# Patient Record
Sex: Male | Born: 1965 | Race: White | Hispanic: No | Marital: Married | Smoking: Never smoker
Health system: Southern US, Community
[De-identification: ages and names within clinical notes are randomized; demographics above are authoritative.]

## PROBLEM LIST (undated history)

## (undated) DIAGNOSIS — R7301 Impaired fasting glucose: Secondary | ICD-10-CM

## (undated) DIAGNOSIS — J309 Allergic rhinitis, unspecified: Secondary | ICD-10-CM

## (undated) DIAGNOSIS — E785 Hyperlipidemia, unspecified: Secondary | ICD-10-CM

## (undated) DIAGNOSIS — I10 Essential (primary) hypertension: Secondary | ICD-10-CM

## (undated) DIAGNOSIS — E669 Obesity, unspecified: Secondary | ICD-10-CM

## (undated) DIAGNOSIS — G5600 Carpal tunnel syndrome, unspecified upper limb: Secondary | ICD-10-CM

## (undated) HISTORY — PX: VASECTOMY: SHX75

## (undated) HISTORY — DX: Impaired fasting glucose: R73.01

## (undated) HISTORY — DX: Allergic rhinitis, unspecified: J30.9

## (undated) HISTORY — DX: Hyperlipidemia, unspecified: E78.5

## (undated) HISTORY — DX: Essential (primary) hypertension: I10

## (undated) HISTORY — DX: Obesity, unspecified: E66.9

## (undated) HISTORY — PX: OTHER SURGICAL HISTORY: SHX169

## (undated) HISTORY — DX: Carpal tunnel syndrome, unspecified upper limb: G56.00

---

## 2002-12-11 ENCOUNTER — Other Ambulatory Visit: Admission: RE | Admit: 2002-12-11 | Discharge: 2002-12-11 | Payer: Self-pay | Admitting: Urology

## 2005-08-14 ENCOUNTER — Ambulatory Visit (HOSPITAL_COMMUNITY): Admission: RE | Admit: 2005-08-14 | Discharge: 2005-08-14 | Payer: Self-pay | Admitting: General Surgery

## 2012-08-09 ENCOUNTER — Encounter: Payer: Self-pay | Admitting: *Deleted

## 2012-08-12 ENCOUNTER — Encounter: Payer: Self-pay | Admitting: Nurse Practitioner

## 2012-08-12 ENCOUNTER — Ambulatory Visit (INDEPENDENT_AMBULATORY_CARE_PROVIDER_SITE_OTHER): Payer: 59 | Admitting: Nurse Practitioner

## 2012-08-12 ENCOUNTER — Encounter: Payer: Self-pay | Admitting: *Deleted

## 2012-08-12 VITALS — BP 146/104 | HR 70 | Wt 268.1 lb

## 2012-08-12 DIAGNOSIS — Z13220 Encounter for screening for lipoid disorders: Secondary | ICD-10-CM

## 2012-08-12 DIAGNOSIS — I10 Essential (primary) hypertension: Secondary | ICD-10-CM

## 2012-08-12 DIAGNOSIS — E785 Hyperlipidemia, unspecified: Secondary | ICD-10-CM

## 2012-08-12 DIAGNOSIS — Z139 Encounter for screening, unspecified: Secondary | ICD-10-CM

## 2012-08-12 DIAGNOSIS — R7301 Impaired fasting glucose: Secondary | ICD-10-CM

## 2012-08-12 MED ORDER — MONTELUKAST SODIUM 10 MG PO TABS: 10.0000 mg | ORAL_TABLET | Freq: Every day | ORAL | Status: DC

## 2012-08-12 MED ORDER — ENALAPRIL MALEATE 10 MG PO TABS: 10.0000 mg | ORAL_TABLET | Freq: Every day | ORAL | Status: DC

## 2012-08-12 NOTE — Assessment & Plan Note (Signed)
Restart enalapril, BP stable on medication. Lab work ordered.

## 2012-08-12 NOTE — Assessment & Plan Note (Signed)
Met 7 pending. 

## 2012-08-12 NOTE — Assessment & Plan Note (Signed)
Lipid profile pending. 

## 2012-08-12 NOTE — Progress Notes (Signed)
Subjective:  Presents for recheck of his hypertension. Ran out of his medication 3 days ago. Has brought his blood pressure log with him, his BP is running 109-127/70s. No chest pain/ischemic type pain or shortness of breath. Has lost some weight. Walking 3 days per week. Overall doing well with his diet, has cut out a lot of sugar. Also requesting a refill on his Singulair, uses this when necessary allergies which is working very well.  Objective:   BP 146/104  Pulse 70  Wt 268 lb 2 oz (121.621 kg) NAD. Alert, oriented. Lungs clear. Heart regular rate rhythm. No murmur or gallop noted. Carotids no bruits or thrills. Lower extremities no edema.   Assessment: Hypertension uncontrolled today due to noncompliance  Plan: #1 lab work ordered. Refill enalapril. Also refill Singulair per patient request. Encourage weight loss and regular activity. Recheck 6 months, call back sooner if any problems.

## 2012-08-20 LAB — LIPID PANEL
LDL Cholesterol: 108 mg/dL — ABNORMAL HIGH (ref 0–99)
Triglycerides: 178 mg/dL — ABNORMAL HIGH (ref <150)
VLDL: 36 mg/dL (ref 0–40)

## 2012-08-20 LAB — BASIC METABOLIC PANEL
Chloride: 104 mEq/L (ref 96–112)
Potassium: 4.3 mEq/L (ref 3.5–5.3)

## 2012-08-20 LAB — HEPATIC FUNCTION PANEL
ALT: 26 U/L (ref 0–53)
AST: 18 U/L (ref 0–37)
Alkaline Phosphatase: 39 U/L (ref 39–117)
Bilirubin, Direct: 0.2 mg/dL (ref 0.0–0.3)
Indirect Bilirubin: 1 mg/dL — ABNORMAL HIGH (ref 0.0–0.9)

## 2012-08-21 ENCOUNTER — Encounter: Payer: Self-pay | Admitting: Nurse Practitioner

## 2013-02-20 ENCOUNTER — Other Ambulatory Visit: Payer: Self-pay | Admitting: Nurse Practitioner

## 2013-02-20 ENCOUNTER — Telehealth: Payer: Self-pay | Admitting: Family Medicine

## 2013-02-20 MED ORDER — ENALAPRIL MALEATE 10 MG PO TABS: 10.0000 mg | ORAL_TABLET | Freq: Every day | ORAL | Status: DC

## 2013-02-20 NOTE — Telephone Encounter (Signed)
Patient states he needs a refill on his enalapril (VASOTEC) 10 MG tablet.  Has an appointment on next Tuesday for a Follow Up.  [facility].

## 2013-02-25 ENCOUNTER — Encounter: Payer: Self-pay | Admitting: Family Medicine

## 2013-02-25 ENCOUNTER — Ambulatory Visit (INDEPENDENT_AMBULATORY_CARE_PROVIDER_SITE_OTHER): Payer: 59 | Admitting: Family Medicine

## 2013-02-25 VITALS — BP 148/88 | Ht 70.0 in | Wt 273.2 lb

## 2013-02-25 DIAGNOSIS — J309 Allergic rhinitis, unspecified: Secondary | ICD-10-CM | POA: Insufficient documentation

## 2013-02-25 DIAGNOSIS — E785 Hyperlipidemia, unspecified: Secondary | ICD-10-CM

## 2013-02-25 DIAGNOSIS — I10 Essential (primary) hypertension: Secondary | ICD-10-CM

## 2013-02-25 MED ORDER — ENALAPRIL MALEATE 10 MG PO TABS: 10.0000 mg | ORAL_TABLET | Freq: Every day | ORAL | Status: DC

## 2013-02-25 MED ORDER — MONTELUKAST SODIUM 10 MG PO TABS: 10.0000 mg | ORAL_TABLET | Freq: Every day | ORAL | Status: DC

## 2013-02-25 NOTE — Progress Notes (Signed)
  Subjective:    Patient ID: Devon Mora, male    DOB: 29-Aug-1965, 47 y.o.   MRN: [id number]  HPI Pt is here today for a check up.  He needs a refill on his meds. Compliant with meds. Walking regularly. No s e s from med. Watching salt intake  No concerns.   Compliant with his allergy medicine. Overall control things well but did have some wheezing last week after exposure to dust.  Results for orders placed in visit on 08/12/12  BASIC METABOLIC PANEL      Result Value Range   Sodium 137  135 - 145 mEq/L   Potassium 4.3  3.5 - 5.3 mEq/L   Chloride 104  96 - 112 mEq/L   CO2 26  19 - 32 mEq/L   Glucose, Bld 88  70 - 99 mg/dL   BUN 16  6 - 23 mg/dL   Creat 4.09  8.11 - 9.14 mg/dL   Calcium 8.8  8.4 - 78.2 mg/dL  HEPATIC FUNCTION PANEL      Result Value Range   Total Bilirubin 1.2  0.3 - 1.2 mg/dL   Bilirubin, Direct 0.2  0.0 - 0.3 mg/dL   Indirect Bilirubin 1.0 (*) 0.0 - 0.9 mg/dL   Alkaline Phosphatase 39  39 - 117 U/L   AST 18  0 - 37 U/L   ALT 26  0 - 53 U/L   Total Protein 6.5  6.0 - 8.3 g/dL   Albumin 4.2  3.5 - 5.2 g/dL  LIPID PANEL      Result Value Range   Cholesterol 179  0 - 200 mg/dL   Triglycerides 956 (*) <150 mg/dL   HDL 35 (*) >21 mg/dL   Total CHOL/HDL Ratio 5.1     VLDL 36  0 - 40 mg/dL   LDL Cholesterol 308 (*) 0 - 99 mg/dL     Review of Systems No headache no chest pain no back pain no abdominal pain ROS otherwise negative    Objective:   Physical Exam  Alert HEENT normal. Lungs clear. Heart regular in rhythm. Blood pressure good on repeat.      Assessment & Plan:  Impression 1 hypertension good control. #2 allergic rhinitis stable. Plan maintain same meds. Diet exercise discussed. Check in 6 months. WSL

## 2013-08-06 ENCOUNTER — Other Ambulatory Visit: Payer: Self-pay | Admitting: Nurse Practitioner

## 2014-03-03 ENCOUNTER — Other Ambulatory Visit: Payer: Self-pay | Admitting: Family Medicine

## 2014-03-31 ENCOUNTER — Other Ambulatory Visit: Payer: Self-pay | Admitting: Family Medicine

## 2014-05-08 ENCOUNTER — Encounter: Payer: Self-pay | Admitting: Family Medicine

## 2014-05-08 ENCOUNTER — Ambulatory Visit (INDEPENDENT_AMBULATORY_CARE_PROVIDER_SITE_OTHER): Payer: 59 | Admitting: Family Medicine

## 2014-05-08 VITALS — BP 148/94 | Ht 70.0 in | Wt 268.0 lb

## 2014-05-08 DIAGNOSIS — J4520 Mild intermittent asthma, uncomplicated: Secondary | ICD-10-CM

## 2014-05-08 DIAGNOSIS — E785 Hyperlipidemia, unspecified: Secondary | ICD-10-CM

## 2014-05-08 DIAGNOSIS — I10 Essential (primary) hypertension: Secondary | ICD-10-CM

## 2014-05-08 DIAGNOSIS — J301 Allergic rhinitis due to pollen: Secondary | ICD-10-CM

## 2014-05-08 DIAGNOSIS — Z79899 Other long term (current) drug therapy: Secondary | ICD-10-CM

## 2014-05-08 DIAGNOSIS — Z125 Encounter for screening for malignant neoplasm of prostate: Secondary | ICD-10-CM

## 2014-05-08 MED ORDER — ENALAPRIL MALEATE 10 MG PO TABS: ORAL_TABLET | ORAL | Status: DC

## 2014-05-08 MED ORDER — ALBUTEROL SULFATE HFA 108 (90 BASE) MCG/ACT IN AERS: INHALATION_SPRAY | RESPIRATORY_TRACT | Status: DC

## 2014-05-08 MED ORDER — MONTELUKAST SODIUM 10 MG PO TABS: 10.0000 mg | ORAL_TABLET | Freq: Every day | ORAL | Status: DC

## 2014-05-08 NOTE — Progress Notes (Signed)
   Subjective:    Patient ID: Devon Mora, male    DOB: 02-01-1966, 49 y.o.   MRN: [id number]  HPI Patient is here today for a check up.  He needs a refill on his meds. Exercising regularly, now t walking as much as he hoped. tmill now at home   Has not been seen since 02/2013  His inhaler changed, d/t insurance coverage. He said the one he was switched to tastes different and does not work as well. He uses it PRN at night. Uses generally at night time, notes a slight wheeze at night.  Comp with singulair  Gets injections in his lower back for herniated discs.   History of allergies. States overall stable medications.  History of asthma. Got flu shot elsewhere. Uses albuterol several times per week. Review of Systems No headache no chest pain no back pain abdominal pain no change in bowel habits no blood in stool    Objective:   Physical Exam  Alert no acute distress. Vitals stable. Blood pressure decent repeat HEENT sinus congestion neck supple. Lungs no wheezes currently heart regular in rhythm      Assessment & Plan:  Impression 1 hypertension good control #2 mild intermittent asthma discussed #3 allergic rhinitis stable #4 hyperlipidemia status uncertain. #5 impaired fasting glucose status uncertain plan diet discussed exercise discussed. Appropriate blood work. Medications refilled. Recheck in several months for complete wellness exam. WSL

## 2014-05-09 DIAGNOSIS — J4520 Mild intermittent asthma, uncomplicated: Secondary | ICD-10-CM | POA: Insufficient documentation

## 2014-07-29 ENCOUNTER — Encounter: Payer: Self-pay | Admitting: Family Medicine

## 2014-07-29 ENCOUNTER — Ambulatory Visit (INDEPENDENT_AMBULATORY_CARE_PROVIDER_SITE_OTHER): Payer: 59 | Admitting: Family Medicine

## 2014-07-29 VITALS — BP 154/88 | Temp 100.3°F | Ht 70.0 in | Wt 264.0 lb

## 2014-07-29 DIAGNOSIS — J329 Chronic sinusitis, unspecified: Secondary | ICD-10-CM

## 2014-07-29 DIAGNOSIS — J310 Chronic rhinitis: Secondary | ICD-10-CM

## 2014-07-29 MED ORDER — AMOXICILLIN-POT CLAVULANATE 875-125 MG PO TABS: 1.0000 | ORAL_TABLET | Freq: Two times a day (BID) | ORAL | Status: AC

## 2014-07-29 NOTE — Progress Notes (Signed)
   Subjective:    Patient ID: Devon Mora, male    DOB: [birth year], 49 y.o.   MRN: [id number]  Cough This is a new problem. Episode onset: 3 days ago. Associated symptoms include ear pain, headaches, nasal congestion, a sore throat and wheezing. Treatments tried: nyquil.   No sickness right around the prev wk,  occas cough , non prod  Nasal disch  Energy down  Appetite ok   More wheezing    Review of Systems  HENT: Positive for ear pain and sore throat.   Respiratory: Positive for cough and wheezing.   Neurological: Positive for headaches.       Objective:   Physical Exam Alert mild malaise. Vitals stable frontal maxillary tenderness nasal congestion pharynx normal neck supple lungs rare wheeze heart regular in rhythm       Assessment & Plan:  Impression post viral rhino sinusitis/bronchitis with reactive airways plan albuterol 2 sprays 4 times a day. Antibiotics prescribed. Symptom care discussed WSL

## 2014-08-03 ENCOUNTER — Other Ambulatory Visit: Payer: Self-pay | Admitting: *Deleted

## 2014-08-03 ENCOUNTER — Telehealth: Payer: Self-pay | Admitting: Family Medicine

## 2014-08-03 MED ORDER — OXYMETAZOLINE HCL 0.05 % NA SOLN: 1.0000 | Freq: Two times a day (BID) | NASAL | Status: DC

## 2014-08-03 MED ORDER — PREDNISONE 20 MG PO TABS: ORAL_TABLET | ORAL | Status: DC

## 2014-08-03 MED ORDER — AZITHROMYCIN 250 MG PO TABS: ORAL_TABLET | ORAL | Status: DC

## 2014-08-03 NOTE — Telephone Encounter (Signed)
Discussed with pt

## 2014-08-03 NOTE — Telephone Encounter (Signed)
Patient said that the antibiotic that he was prescribed last week is helping him with his rhinosinusitis.  He said that his head is still full of congestion though and wants to know if we can call something in for this?  OTC products not working.   [place]

## 2014-08-03 NOTE — Telephone Encounter (Signed)
[facility]. meds sent to pharm.

## 2014-08-03 NOTE — Telephone Encounter (Signed)
Seen 4/6 prescribed augmentin.  Pt states his teeth are hurting, slight headache, head congestion, dizzy and having some wheezing mostly at night. Using albuterol inhaler but not helping much.

## 2014-08-03 NOTE — Telephone Encounter (Signed)
Adult pred taper plus afrin nasal spray plus zpk to add to augmentin

## 2014-08-07 ENCOUNTER — Ambulatory Visit (INDEPENDENT_AMBULATORY_CARE_PROVIDER_SITE_OTHER): Payer: 59 | Admitting: Family Medicine

## 2014-08-07 ENCOUNTER — Encounter: Payer: Self-pay | Admitting: Family Medicine

## 2014-08-07 VITALS — BP 112/80 | Ht 70.0 in | Wt 267.0 lb

## 2014-08-07 DIAGNOSIS — I10 Essential (primary) hypertension: Secondary | ICD-10-CM

## 2014-08-07 DIAGNOSIS — Z0001 Encounter for general adult medical examination with abnormal findings: Secondary | ICD-10-CM

## 2014-08-07 DIAGNOSIS — J4520 Mild intermittent asthma, uncomplicated: Secondary | ICD-10-CM | POA: Diagnosis not present

## 2014-08-07 LAB — BASIC METABOLIC PANEL
BUN: 18 mg/dL (ref 6–23)
CALCIUM: 9 mg/dL (ref 8.4–10.5)
CO2: 27 mEq/L (ref 19–32)
Chloride: 104 mEq/L (ref 96–112)
Creat: 0.81 mg/dL (ref 0.50–1.35)
GLUCOSE: 76 mg/dL (ref 70–99)
Potassium: 4.3 mEq/L (ref 3.5–5.3)
Sodium: 140 mEq/L (ref 135–145)

## 2014-08-07 LAB — HEPATIC FUNCTION PANEL
ALT: 28 U/L (ref 0–53)
AST: 17 U/L (ref 0–37)
Albumin: 4.2 g/dL (ref 3.5–5.2)
Alkaline Phosphatase: 40 U/L (ref 39–117)
BILIRUBIN DIRECT: 0.1 mg/dL (ref 0.0–0.3)
BILIRUBIN TOTAL: 0.8 mg/dL (ref 0.2–1.2)
Indirect Bilirubin: 0.7 mg/dL (ref 0.2–1.2)
Total Protein: 6.9 g/dL (ref 6.0–8.3)

## 2014-08-07 LAB — LIPID PANEL
CHOLESTEROL: 185 mg/dL (ref 0–200)
HDL: 32 mg/dL — ABNORMAL LOW (ref >=40)
LDL Cholesterol: 123 mg/dL — ABNORMAL HIGH (ref 0–99)
TRIGLYCERIDES: 149 mg/dL (ref <150)
Total CHOL/HDL Ratio: 5.8 Ratio
VLDL: 30 mg/dL (ref 0–40)

## 2014-08-07 LAB — PSA: PSA: 0.21 ng/mL (ref <=4.00)

## 2014-08-07 MED ORDER — ENALAPRIL MALEATE 10 MG PO TABS: ORAL_TABLET | ORAL | Status: DC

## 2014-08-07 NOTE — Progress Notes (Signed)
Subjective:    Patient ID: Devon Mora, male    DOB: 12-15-1965, 49 y.o.   MRN: [id number]  Hypertension This is a chronic problem. The current episode started more than 1 year ago. The problem has been gradually improving since onset. The problem is controlled. Pertinent negatives include no chest pain, headaches or neck pain. There are no associated agents to hypertension. There are no known risk factors for coronary artery disease. Treatments tried: enalapril. The current treatment provides significant improvement. There are no compliance problems.    Patient has concerns about his weight.   cking bp's at home generally numbers are good 120s to 70s, numbers generlly good  Results for orders placed or performed in visit on 05/08/14  Lipid panel  Result Value Ref Range   Cholesterol 185 0 - 200 mg/dL   Triglycerides 149 <150 mg/dL   HDL 32 (L) >=40 mg/dL   Total CHOL/HDL Ratio 5.8 Ratio   VLDL 30 0 - 40 mg/dL   LDL Cholesterol 123 (H) 0 - 99 mg/dL  Hepatic function panel  Result Value Ref Range   Total Bilirubin 0.8 0.2 - 1.2 mg/dL   Bilirubin, Direct 0.1 0.0 - 0.3 mg/dL   Indirect Bilirubin 0.7 0.2 - 1.2 mg/dL   Alkaline Phosphatase 40 39 - 117 U/L   AST 17 0 - 37 U/L   ALT 28 0 - 53 U/L   Total Protein 6.9 6.0 - 8.3 g/dL   Albumin 4.2 3.5 - 5.2 g/dL  Basic metabolic panel  Result Value Ref Range   Sodium 140 135 - 145 mEq/L   Potassium 4.3 3.5 - 5.3 mEq/L   Chloride 104 96 - 112 mEq/L   CO2 27 19 - 32 mEq/L   Glucose, Bld 76 70 - 99 mg/dL   BUN 18 6 - 23 mg/dL   Creat 0.81 0.50 - 1.35 mg/dL   Calcium 9.0 8.4 - 10.5 mg/dL  PSA  Result Value Ref Range   PSA 0.21 <=4.00 ng/mL   Fating til 10 tha t night    Review of Systems  Constitutional: Negative for fever, activity change and appetite change.  HENT: Negative for congestion and rhinorrhea.   Eyes: Negative for discharge.  Respiratory: Negative for cough and wheezing.   Cardiovascular: Negative for chest  pain.  Gastrointestinal: Negative for vomiting, abdominal pain and blood in stool.  Genitourinary: Negative for frequency and difficulty urinating.  Musculoskeletal: Negative for neck pain.  Skin: Negative for rash.  Allergic/Immunologic: Negative for environmental allergies and food allergies.  Neurological: Negative for weakness and headaches.  Psychiatric/Behavioral: Negative for agitation.  All other systems reviewed and are negative.      Objective:   Physical Exam  Constitutional: He appears well-developed and well-nourished.  Obesity noted  HENT:  Head: Normocephalic and atraumatic.  Right Ear: External ear normal.  Left Ear: External ear normal.  Nose: Nose normal.  Mouth/Throat: Oropharynx is clear and moist.  Eyes: EOM are normal. Pupils are equal, round, and reactive to light.  Neck: Normal range of motion. Neck supple. No thyromegaly present.  Cardiovascular: Normal rate, regular rhythm and normal heart sounds.   No murmur heard. Pulmonary/Chest: Effort normal and breath sounds normal. No respiratory distress. He has no wheezes.  Abdominal: Soft. Bowel sounds are normal. He exhibits no distension and no mass. There is no tenderness.  Genitourinary: Prostate normal and penis normal.  Musculoskeletal: Normal range of motion. He exhibits no edema.  Lymphadenopathy:  He has no cervical adenopathy.  Neurological: He is alert. He exhibits normal muscle tone.  Skin: Skin is warm and dry. No erythema.  Psychiatric: He has a normal mood and affect. His behavior is normal. Judgment normal.  Vitals reviewed.         Assessment & Plan:  Impression #1 wellness exam #2 obesity discussed and died encourage #3 hypertension good control to maintain same medication. #4 preventive interventions discussed #5 recent stress with son having died prematurely. States it has been slow healing process for over one year now. Plan nature of grief discussed. Maintain same blood pressure  medication. GI sheet given. Please call gastroenterologist. Diet exercise discussed recheck in 6 months WSL

## 2014-12-24 ENCOUNTER — Other Ambulatory Visit: Payer: Self-pay | Admitting: Family Medicine

## 2014-12-29 ENCOUNTER — Encounter: Payer: Self-pay | Admitting: Family Medicine

## 2014-12-29 ENCOUNTER — Ambulatory Visit (INDEPENDENT_AMBULATORY_CARE_PROVIDER_SITE_OTHER): Payer: 59 | Admitting: Family Medicine

## 2014-12-29 VITALS — BP 130/90 | Temp 98.7°F | Ht 70.0 in | Wt 265.2 lb

## 2014-12-29 DIAGNOSIS — H8110 Benign paroxysmal vertigo, unspecified ear: Secondary | ICD-10-CM

## 2014-12-29 MED ORDER — ONDANSETRON 4 MG PO TBDP
4.0000 mg | ORAL_TABLET | Freq: Three times a day (TID) | ORAL | Status: DC | PRN
Start: 2014-12-29 — End: 2017-02-12

## 2014-12-29 MED ORDER — MECLIZINE HCL 25 MG PO TABS: 25.0000 mg | ORAL_TABLET | Freq: Three times a day (TID) | ORAL | Status: DC | PRN

## 2014-12-29 NOTE — Progress Notes (Signed)
   Subjective:    Patient ID: Devon Mora, male    DOB: Jan 02, 1966, 49 y.o.   MRN: [id number]  Dizziness This is a new problem. The current episode started in the past 7 days. The problem occurs intermittently. The problem has been unchanged. Associated symptoms include fatigue. Nothing aggravates the symptoms. He has tried rest for the symptoms. The treatment provided mild relief.  Patient states that the dizziness started after he took his Enalapril on Sunday.    Not as bad today  Rolling over hits sudden and hard  With nausea and spinning sens ation   Patient states that he has no other concerns at this time.   Takes allergy pills compliand and b p pil faithfully     Review of Systems  Constitutional: Positive for fatigue.  Neurological: Positive for dizziness.       Objective:   Physical Exam  Alert vitals stable blood pressure good. Lungs clear. Heart regular in rhythm. HEENT normal. Neuro exam intact.      Assessment & Plan:  Impression acute vertigo discussed and educated at length in regards to this plan Antivert when necessary. Zofran when necessary. Expect gradual resolution if he becomes chronic return for further interventions WSL not blood pressure medicine related

## 2015-02-23 ENCOUNTER — Ambulatory Visit (INDEPENDENT_AMBULATORY_CARE_PROVIDER_SITE_OTHER): Payer: 59 | Admitting: Family Medicine

## 2015-02-23 ENCOUNTER — Encounter: Payer: Self-pay | Admitting: Family Medicine

## 2015-02-23 VITALS — BP 118/80 | Temp 98.3°F | Ht 70.0 in | Wt 273.4 lb

## 2015-02-23 DIAGNOSIS — J329 Chronic sinusitis, unspecified: Secondary | ICD-10-CM | POA: Diagnosis not present

## 2015-02-23 DIAGNOSIS — H8110 Benign paroxysmal vertigo, unspecified ear: Secondary | ICD-10-CM | POA: Diagnosis not present

## 2015-02-23 MED ORDER — AMOXICILLIN-POT CLAVULANATE 875-125 MG PO TABS: 1.0000 | ORAL_TABLET | Freq: Two times a day (BID) | ORAL | Status: AC

## 2015-02-23 NOTE — Progress Notes (Signed)
   Subjective:    Patient ID: Devon Mora, male    DOB: 16-Sep-1965, 49 y.o.   MRN: [id number]  Sinusitis This is a new problem. The current episode started in the past 7 days. The problem is unchanged. There has been no fever. The pain is mild. Associated symptoms include congestion, coughing and ear pain. (Dizziness) Past treatments include oral decongestants. The treatment provided no relief.   Patient has no other concerns at this time.  Started with cong  Moved into a congested cough, nasal con, not much  Dim energy and achey and  Ough, sat night unsteady  Left ear ringing  Tried prn antivert didn't help at all   Review of Systems  HENT: Positive for congestion and ear pain.   Respiratory: Positive for cough.        Objective:   Physical Exam  Alert moderate malaise. Vitals stable HET moderate nasal congestion frontal tenderness right TM retracted erythematous neck supple lungs clear heart regular in rhythm      Assessment & Plan:  Impression post viral rhinosinusitis with element of right otitis media plan antibiotics prescribed. Symptom care discussed warning signs discussed in her ear secondary features discussed WSL

## 2015-02-23 NOTE — Progress Notes (Deleted)
   Subjective:    Patient ID: Devon Mora, male    DOB: 06/24/1965, 49 y.o.   MRN: [id number]  HPI    Review of Systems     Objective:   Physical Exam        Assessment & Plan:

## 2015-03-02 ENCOUNTER — Other Ambulatory Visit: Payer: Self-pay | Admitting: Family Medicine

## 2015-04-01 ENCOUNTER — Telehealth: Payer: Self-pay | Admitting: Family Medicine

## 2015-04-01 MED ORDER — ENALAPRIL MALEATE 10 MG PO TABS: ORAL_TABLET | ORAL | Status: DC

## 2015-04-01 NOTE — Telephone Encounter (Signed)
Notified patient med sent to pharmacy.  

## 2015-04-01 NOTE — Telephone Encounter (Signed)
Pt is needing a refill on his enalapril 10 mg sent to [facility]. Pt has an appt scheduled for 12/23

## 2015-04-09 LAB — IFOBT (OCCULT BLOOD): IMMUNOLOGICAL FECAL OCCULT BLOOD TEST: NEGATIVE

## 2015-04-16 ENCOUNTER — Encounter: Payer: Self-pay | Admitting: Family Medicine

## 2015-04-16 ENCOUNTER — Ambulatory Visit (INDEPENDENT_AMBULATORY_CARE_PROVIDER_SITE_OTHER): Payer: 59 | Admitting: Family Medicine

## 2015-04-16 VITALS — BP 148/92 | Temp 98.7°F | Ht 70.0 in | Wt 269.0 lb

## 2015-04-16 DIAGNOSIS — F4321 Adjustment disorder with depressed mood: Secondary | ICD-10-CM | POA: Insufficient documentation

## 2015-04-16 MED ORDER — PREDNISONE 20 MG PO TABS: ORAL_TABLET | ORAL | Status: DC

## 2015-04-16 MED ORDER — MONTELUKAST SODIUM 10 MG PO TABS: 10.0000 mg | ORAL_TABLET | Freq: Every day | ORAL | Status: DC

## 2015-04-16 MED ORDER — PHENTERMINE HCL 37.5 MG PO CAPS: 37.5000 mg | ORAL_CAPSULE | ORAL | Status: DC

## 2015-04-16 MED ORDER — ENALAPRIL MALEATE 10 MG PO TABS: ORAL_TABLET | ORAL | Status: DC

## 2015-04-16 MED ORDER — AZITHROMYCIN 250 MG PO TABS: ORAL_TABLET | ORAL | Status: DC

## 2015-04-16 MED ORDER — ALBUTEROL SULFATE HFA 108 (90 BASE) MCG/ACT IN AERS: INHALATION_SPRAY | RESPIRATORY_TRACT | Status: DC

## 2015-04-16 NOTE — Progress Notes (Signed)
   Subjective:    Patient ID: Devon Mora, male    DOB: [birth year], 49 y.o.   MRN: [id number]  Hypertension This is a chronic problem. The current episode started more than 1 year ago. Treatments tried: enalapril.   compliant with blood pressure medication. Meds reviewed today. Generally does not miss notes.   Wheezing at night. Using inhaler every night. Started a couple of weeks ago. Nocturnal wheeze and whistle. Last couple weeks at least has had a hit ea night of the inhaler  Dizzy one spell , transient nature.  Having a lot of stress. Had a son who sadly died a year and half ago under challenging circumstances. No suicidal homicidal thoughts. Positive tears spells positive grieving still   Reports overall asthma and allergy stable when taking the Singulair. No obvious side effects.     Review of Systems No headache no chest pain and back pain abdominal pain no change in bowel habits    Objective:   Physical Exam  Alert vital stable blood pressure repeat H&T mom his congestion frontal neck supple lungs bilateral wheezes very minimal heart regular rhythm ankles without edema mental status affect appropriate      Assessment & Plan:  Impression protracted grief discussed #2 obesity discussed BMI high enough for intervention #2 subacute bronchitis #3 asthma #4 hypertension plan appropriate blood work. Diet exercise discussed. Initiate phentermine rationale discussed prednisone taper. Maintain other medications and refilled recheck every 6 months patient declines any medicine for mental health challenges at this time

## 2015-05-12 ENCOUNTER — Encounter: Payer: Self-pay | Admitting: Family Medicine

## 2015-06-02 ENCOUNTER — Other Ambulatory Visit: Payer: Self-pay | Admitting: Family Medicine

## 2015-07-13 ENCOUNTER — Ambulatory Visit (INDEPENDENT_AMBULATORY_CARE_PROVIDER_SITE_OTHER): Payer: 59 | Admitting: Family Medicine

## 2015-07-13 ENCOUNTER — Encounter: Payer: Self-pay | Admitting: Family Medicine

## 2015-07-13 VITALS — BP 122/82 | Temp 99.0°F | Ht 70.0 in | Wt 248.0 lb

## 2015-07-13 DIAGNOSIS — J111 Influenza due to unidentified influenza virus with other respiratory manifestations: Secondary | ICD-10-CM

## 2015-07-13 DIAGNOSIS — T148 Other injury of unspecified body region: Secondary | ICD-10-CM

## 2015-07-13 DIAGNOSIS — W57XXXA Bitten or stung by nonvenomous insect and other nonvenomous arthropods, initial encounter: Secondary | ICD-10-CM

## 2015-07-13 MED ORDER — DOXYCYCLINE HYCLATE 100 MG PO CAPS: 100.0000 mg | ORAL_CAPSULE | Freq: Two times a day (BID) | ORAL | Status: DC

## 2015-07-13 NOTE — Progress Notes (Signed)
   Subjective:    Patient ID: [name], male    DOB: May 29, 1965, 50 y.o.   MRN: [id number]  Cough This is a new problem. The current episode started in the past 7 days. Associated symptoms include a fever, myalgias, nasal congestion, rhinorrhea and wheezing. Pertinent negatives include no chest pain or ear pain. Treatments tried: pain meds.  Significant head congestion drainage coughing sinus pressure in addition this feeling hot and cold low-grade fevers denies wheezing or difficulty breathing    Review of Systems  Constitutional: Positive for fever. Negative for activity change.  HENT: Positive for congestion and rhinorrhea. Negative for ear pain.   Eyes: Negative for discharge.  Respiratory: Positive for cough and wheezing.   Cardiovascular: Negative for chest pain.  Musculoskeletal: Positive for myalgias.       Objective:   Physical Exam  Constitutional: He appears well-developed.  HENT:  Head: Normocephalic.  Mouth/Throat: Oropharynx is clear and moist. No oropharyngeal exudate.  Neck: Normal range of motion.  Cardiovascular: Normal rate, regular rhythm and normal heart sounds.   No murmur heard. Pulmonary/Chest: Effort normal and breath sounds normal. He has no wheezes.  Lymphadenopathy:    He has no cervical adenopathy.  Neurological: He exhibits normal muscle tone.  Skin: Skin is warm and dry.  Nursing note and vitals reviewed.   Did have a tick bite on the right leg a small erythematous area that slightly tender approximately 3-4 mm in diameter the tick is a standard dog tick he brought in no pus with this.      Assessment & Plan:  Tick bite-doxycycline twice a day 7 days  Mild case of the flu has been going on long enough to where would not recommend Tamiflu if progressive troubles or if worse follow-up warning signs were discussed

## 2015-09-14 ENCOUNTER — Telehealth: Payer: Self-pay | Admitting: Family Medicine

## 2015-09-14 DIAGNOSIS — Z79899 Other long term (current) drug therapy: Secondary | ICD-10-CM

## 2015-09-14 DIAGNOSIS — Z125 Encounter for screening for malignant neoplasm of prostate: Secondary | ICD-10-CM

## 2015-09-14 DIAGNOSIS — I10 Essential (primary) hypertension: Secondary | ICD-10-CM

## 2015-09-14 NOTE — Telephone Encounter (Signed)
Pt scheduled for PE 5/36/17 - wonders if he needs to get any lab work done Please advise & call pt when done

## 2015-09-16 NOTE — Telephone Encounter (Signed)
Patient notified that bloodwork has been ordered.  

## 2015-09-16 NOTE — Telephone Encounter (Signed)
Lip liv m7 psa 

## 2015-09-17 ENCOUNTER — Encounter: Payer: 59 | Admitting: Family Medicine

## 2015-09-21 ENCOUNTER — Ambulatory Visit (INDEPENDENT_AMBULATORY_CARE_PROVIDER_SITE_OTHER): Payer: 59 | Admitting: Family Medicine

## 2015-09-21 ENCOUNTER — Encounter: Payer: Self-pay | Admitting: Family Medicine

## 2015-09-21 VITALS — BP 126/84 | Ht 70.0 in | Wt 242.6 lb

## 2015-09-21 DIAGNOSIS — H9191 Unspecified hearing loss, right ear: Secondary | ICD-10-CM | POA: Diagnosis not present

## 2015-09-21 DIAGNOSIS — I10 Essential (primary) hypertension: Secondary | ICD-10-CM | POA: Diagnosis not present

## 2015-09-21 DIAGNOSIS — E785 Hyperlipidemia, unspecified: Secondary | ICD-10-CM | POA: Diagnosis not present

## 2015-09-21 DIAGNOSIS — Z0189 Encounter for other specified special examinations: Secondary | ICD-10-CM

## 2015-09-21 DIAGNOSIS — Z0000 Encounter for general adult medical examination without abnormal findings: Secondary | ICD-10-CM

## 2015-09-21 MED ORDER — MONTELUKAST SODIUM 10 MG PO TABS: 10.0000 mg | ORAL_TABLET | Freq: Every day | ORAL | Status: DC

## 2015-09-21 MED ORDER — ENALAPRIL MALEATE 10 MG PO TABS: ORAL_TABLET | ORAL | Status: DC

## 2015-09-21 NOTE — Progress Notes (Signed)
   Subjective:    Patient ID: Devon Mora, male    DOB: 25-May-1965, 50 y.o.   MRN: [id number]  HPI  The patient comes in today for a wellness visit.  BP meds faithful. Numbers at home 116 over 70 and generally good, no side effects.  Walking just about every day, up and down of late   A review of their health history was completed.  A review of medications was also completed.  Any needed refills; yes  Eating habits: trying to do better  Falls/  MVA accidents in past few months: no  Regular exercise: walking  Specialist pt sees on regular basis: no  Preventative health issues were discussed.   Additional concerns: none  Results for orders placed or performed in visit on 05/12/15  IFOBT POC (occult bld, rslt in office)  Result Value Ref Range   IFOBT Negative    singulair helps pt has added flonase, uses vent only once per mo or so  Pt notes hearing loss right ear, trouble hearing alarm colock, coming on over the past month, pos ringing , not , no know n hx of gun shooting excess, but pos industrial exposure      Review of Systems  Constitutional: Negative for fever, activity change and appetite change.  HENT: Negative for congestion and rhinorrhea.   Eyes: Negative for discharge.  Respiratory: Negative for cough and wheezing.   Cardiovascular: Negative for chest pain.  Gastrointestinal: Negative for vomiting, abdominal pain and blood in stool.  Genitourinary: Negative for frequency and difficulty urinating.  Musculoskeletal: Negative for neck pain.  Skin: Negative for rash.  Allergic/Immunologic: Negative for environmental allergies and food allergies.  Neurological: Negative for weakness and headaches.  Psychiatric/Behavioral: Negative for agitation.  All other systems reviewed and are negative.      Objective:   Physical Exam  Constitutional: He appears well-developed and well-nourished.  HENT:  Head: Normocephalic and atraumatic.  Right Ear: External  ear normal.  Left Ear: External ear normal.  Nose: Nose normal.  Mouth/Throat: Oropharynx is clear and moist.  Eyes: EOM are normal. Pupils are equal, round, and reactive to light.  Neck: Normal range of motion. Neck supple. No thyromegaly present.  Cardiovascular: Normal rate, regular rhythm and normal heart sounds.   No murmur heard. Pulmonary/Chest: Effort normal and breath sounds normal. No respiratory distress. He has no wheezes.  Abdominal: Soft. Bowel sounds are normal. He exhibits no distension and no mass. There is no tenderness.  Genitourinary: Penis normal.  Musculoskeletal: Normal range of motion. He exhibits no edema.  Lymphadenopathy:    He has no cervical adenopathy.  Neurological: He is alert. He exhibits normal muscle tone.  Skin: Skin is warm and dry. No erythema.  Psychiatric: He has a normal mood and affect. His behavior is normal. Judgment normal.   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 Which I think is reasonable. #4 allergic rhinitis plan appropriate blood work. Diet exercise discussed. ENT referral. Patient to press on with colonoscopy sheet given WSL recheck in 6 months

## 2015-09-23 LAB — HEPATIC FUNCTION PANEL
ALBUMIN: 4.5 g/dL (ref 3.5–5.5)
ALK PHOS: 45 IU/L (ref 39–117)
ALT: 24 IU/L (ref 0–44)
AST: 19 IU/L (ref 0–40)
BILIRUBIN, DIRECT: 0.23 mg/dL (ref 0.00–0.40)
Bilirubin Total: 1.1 mg/dL (ref 0.0–1.2)
TOTAL PROTEIN: 6.9 g/dL (ref 6.0–8.5)

## 2015-09-23 LAB — BASIC METABOLIC PANEL
BUN / CREAT RATIO: 14 (ref 9–20)
BUN: 14 mg/dL (ref 6–24)
CO2: 25 mmol/L (ref 18–29)
CREATININE: 1.02 mg/dL (ref 0.76–1.27)
Calcium: 10 mg/dL (ref 8.7–10.2)
Chloride: 101 mmol/L (ref 96–106)
GFR calc Af Amer: 98 mL/min/{1.73_m2} (ref >59)
GFR, EST NON AFRICAN AMERICAN: 85 mL/min/{1.73_m2} (ref >59)
Glucose: 109 mg/dL — ABNORMAL HIGH (ref 65–99)
POTASSIUM: 4.7 mmol/L (ref 3.5–5.2)
SODIUM: 140 mmol/L (ref 134–144)

## 2015-09-23 LAB — LIPID PANEL
Chol/HDL Ratio: 5.6 ratio units — ABNORMAL HIGH (ref 0.0–5.0)
Cholesterol, Total: 217 mg/dL — ABNORMAL HIGH (ref 100–199)
HDL: 39 mg/dL — ABNORMAL LOW (ref >39)
LDL Calculated: 120 mg/dL — ABNORMAL HIGH (ref 0–99)
Triglycerides: 290 mg/dL — ABNORMAL HIGH (ref 0–149)
VLDL Cholesterol Cal: 58 mg/dL — ABNORMAL HIGH (ref 5–40)

## 2015-09-23 LAB — PSA: Prostate Specific Ag, Serum: 0.2 ng/mL (ref 0.0–4.0)

## 2015-09-24 ENCOUNTER — Encounter: Payer: Self-pay | Admitting: Family Medicine

## 2015-09-26 ENCOUNTER — Encounter: Payer: Self-pay | Admitting: Family Medicine

## 2015-09-28 ENCOUNTER — Telehealth: Payer: Self-pay | Admitting: Family Medicine

## 2015-09-28 NOTE — Telephone Encounter (Signed)
Pt left physician results form to be filled out after his bw came back in  Form in your yellow folder

## 2015-10-04 NOTE — Telephone Encounter (Signed)
Checking on the status of this  Done?

## 2015-10-05 NOTE — Telephone Encounter (Signed)
Spoke with patient and informed him that form was completed.Patient asked if form was faxed. Was unsure if form was faxed.

## 2015-10-05 NOTE — Telephone Encounter (Signed)
Ok

## 2015-10-05 NOTE — Telephone Encounter (Signed)
Erica just faxed it this morning

## 2015-10-05 NOTE — Telephone Encounter (Signed)
done

## 2015-10-25 ENCOUNTER — Ambulatory Visit (INDEPENDENT_AMBULATORY_CARE_PROVIDER_SITE_OTHER): Payer: 59 | Admitting: Otolaryngology

## 2015-10-25 DIAGNOSIS — H9041 Sensorineural hearing loss, unilateral, right ear, with unrestricted hearing on the contralateral side: Secondary | ICD-10-CM | POA: Diagnosis not present

## 2015-10-25 DIAGNOSIS — R42 Dizziness and giddiness: Secondary | ICD-10-CM | POA: Diagnosis not present

## 2015-10-27 ENCOUNTER — Other Ambulatory Visit (INDEPENDENT_AMBULATORY_CARE_PROVIDER_SITE_OTHER): Payer: Self-pay | Admitting: Otolaryngology

## 2015-10-27 DIAGNOSIS — H9041 Sensorineural hearing loss, unilateral, right ear, with unrestricted hearing on the contralateral side: Secondary | ICD-10-CM

## 2015-11-10 ENCOUNTER — Ambulatory Visit (HOSPITAL_COMMUNITY): Payer: 59 | Attending: Otolaryngology

## 2015-11-15 ENCOUNTER — Ambulatory Visit (INDEPENDENT_AMBULATORY_CARE_PROVIDER_SITE_OTHER): Payer: 59 | Admitting: Otolaryngology

## 2015-11-15 DIAGNOSIS — H9313 Tinnitus, bilateral: Secondary | ICD-10-CM

## 2015-11-15 DIAGNOSIS — H903 Sensorineural hearing loss, bilateral: Secondary | ICD-10-CM

## 2016-02-20 ENCOUNTER — Encounter: Payer: Self-pay | Admitting: Family Medicine

## 2016-04-15 ENCOUNTER — Other Ambulatory Visit: Payer: Self-pay | Admitting: Family Medicine

## 2016-05-10 ENCOUNTER — Ambulatory Visit: Payer: Self-pay | Admitting: Family Medicine

## 2016-05-12 ENCOUNTER — Encounter: Payer: Self-pay | Admitting: Nurse Practitioner

## 2016-05-12 ENCOUNTER — Ambulatory Visit (INDEPENDENT_AMBULATORY_CARE_PROVIDER_SITE_OTHER): Payer: BC Managed Care – PPO | Admitting: Nurse Practitioner

## 2016-05-12 VITALS — BP 120/80 | Ht 70.0 in | Wt 258.0 lb

## 2016-05-12 DIAGNOSIS — I10 Essential (primary) hypertension: Secondary | ICD-10-CM | POA: Diagnosis not present

## 2016-05-12 MED ORDER — ENALAPRIL MALEATE 10 MG PO TABS: ORAL_TABLET | ORAL | 1 refills | Status: DC

## 2016-05-12 NOTE — Progress Notes (Signed)
Subjective:  Presents for recheck on HTN. Continues to work on weight. Weighed fully clothed in office with steel toed boots on. Weighed 245 lbs at home. Active job. Also chops wood. No CP/ischemic type pain or SOB. Compliant with meds.  Objective:   BP 120/80   Ht 5\' 10"  (1.778 m)   Wt 258 lb (117 kg)   BMI 37.02 kg/m  NAD. Alert, oriented. Lungs clear. Heart RRR.  Assessment:   Problem List Items Addressed This Visit      Cardiovascular and Mediastinum   Hypertension - Primary   Relevant Medications   enalapril (VASOTEC) 10 MG tablet       Plan:  Meds ordered this encounter  Medications  . enalapril (VASOTEC) 10 MG tablet    Sig: TAKE ONE (1) TABLET BY MOUTH EVERY DAY    Dispense:  90 tablet    Refill:  1    Order Specific Question:   Supervising Provider    Answer:   Mikey Kirschner [[id number]]   Continue activity and weight loss efforts. Reviewed last labs. Low fat diet with daily Omega 3 supplement. Office visit in 6 months with labs.

## 2016-05-12 NOTE — Patient Instructions (Signed)
Qsymia Belviq Contrave 

## 2016-05-22 ENCOUNTER — Telehealth: Payer: Self-pay | Admitting: Family Medicine

## 2016-05-22 MED ORDER — MONTELUKAST SODIUM 10 MG PO TABS: 10.0000 mg | ORAL_TABLET | Freq: Every day | ORAL | 1 refills | Status: DC

## 2016-05-22 MED ORDER — ALBUTEROL SULFATE HFA 108 (90 BASE) MCG/ACT IN AERS: INHALATION_SPRAY | RESPIRATORY_TRACT | 1 refills | Status: DC

## 2016-05-22 NOTE — Telephone Encounter (Signed)
Pt is needing refills on his albuterol (VENTOLIN HFA) 108 (90 BASE) MCG/ACT inhaler and his montelukast (SINGULAIR) 10 MG tablet   [facility] [place]

## 2016-05-22 NOTE — Telephone Encounter (Signed)
Notified patient refills sent to pharmacy.

## 2016-09-11 ENCOUNTER — Ambulatory Visit (INDEPENDENT_AMBULATORY_CARE_PROVIDER_SITE_OTHER): Payer: BC Managed Care – PPO | Admitting: Family Medicine

## 2016-09-11 ENCOUNTER — Encounter: Payer: Self-pay | Admitting: Family Medicine

## 2016-09-11 VITALS — BP 122/80 | Temp 98.8°F | Ht 70.0 in | Wt 254.0 lb

## 2016-09-11 DIAGNOSIS — R42 Dizziness and giddiness: Secondary | ICD-10-CM | POA: Diagnosis not present

## 2016-09-11 MED ORDER — DIAZEPAM 5 MG PO TABS: ORAL_TABLET | ORAL | 1 refills | Status: DC

## 2016-09-11 NOTE — Progress Notes (Signed)
   Subjective:    Patient ID: Devon Mora, male    DOB: [birth year], 51 y.o.   MRN: [id number]  Dizziness  This is a new problem. The current episode started in the past 7 days. Associated symptoms include vertigo.   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   Review of Systems  Neurological: Positive for dizziness and vertigo.       Objective:   Physical Exam Alert vitals stable, NAD. Blood pressure good on repeat. HEENT normal. Lungs clear. Heart regular rate and rhythm. Neuro exam intact negative cerebellar findings       Assessment & Plan:  Impression acute vertigo. Patient has had in the past. No known triggers this time. Antivert not helping much plan add Valium 5 mg every 6 hours when necessary rationale discussed no further workup at this time

## 2016-11-09 ENCOUNTER — Ambulatory Visit (INDEPENDENT_AMBULATORY_CARE_PROVIDER_SITE_OTHER): Payer: BC Managed Care – PPO | Admitting: Family Medicine

## 2016-11-09 ENCOUNTER — Encounter: Payer: Self-pay | Admitting: Family Medicine

## 2016-11-09 VITALS — BP 124/82 | Ht 70.0 in | Wt 256.2 lb

## 2016-11-09 DIAGNOSIS — E7800 Pure hypercholesterolemia, unspecified: Secondary | ICD-10-CM

## 2016-11-09 DIAGNOSIS — Z125 Encounter for screening for malignant neoplasm of prostate: Secondary | ICD-10-CM

## 2016-11-09 DIAGNOSIS — I10 Essential (primary) hypertension: Secondary | ICD-10-CM | POA: Diagnosis not present

## 2016-11-09 DIAGNOSIS — R42 Dizziness and giddiness: Secondary | ICD-10-CM

## 2016-11-09 MED ORDER — MONTELUKAST SODIUM 10 MG PO TABS: 10.0000 mg | ORAL_TABLET | Freq: Every day | ORAL | 1 refills | Status: DC

## 2016-11-09 MED ORDER — ENALAPRIL MALEATE 10 MG PO TABS: ORAL_TABLET | ORAL | 1 refills | Status: DC

## 2016-11-09 MED ORDER — PHENTERMINE HCL 37.5 MG PO TABS: 37.5000 mg | ORAL_TABLET | Freq: Every day | ORAL | 2 refills | Status: DC

## 2016-11-09 NOTE — Progress Notes (Signed)
   Subjective:    Patient ID: Devon Mora, male    DOB: 1965-06-15, 51 y.o.   MRN: [id number]  Hypertension  This is a chronic problem. The current episode started more than 1 year ago.   Blood pressure medicine and blood pressure levels reviewed today with patient. Compliant with blood pressure medicine. States does not miss a dose. No obvious side effects. Blood pressure generally good when checked elsewhere. Watching salt intake.  120 or so over 75 or so Patient states no concerns this visit.   Still wheezeds qhs, no sig wheezing during day. Still using inhaler. Definitely the Singulair helps.    Used phentermine in the past. Concerned about his weight. Would like to get back on some medication in this regard.  Still dealing t with grieving issues but overall resume with his challenges feel    Review of Systems No headache, no major weight loss or weight gain, no chest pain no back pain abdominal pain no change in bowel habits complete ROS otherwise negative     Objective:   Physical Exam  Alert and oriented, vitals reviewed and stable, NAD ENT-TM's and ext canals WNL bilat via otoscopic exam Soft palate, tonsils and post pharynx WNL via oropharyngeal exam Neck-symmetric, no masses; thyroid nonpalpable and nontender Pulmonary-no tachypnea or accessory muscle use; Clear without wheezes via auscultation Card--no abnrml murmurs, rhythm reg and rate WNL Carotid pulses symmetric, without bruits       Assessment & Plan:  Impression 1 hypertension good control discussed maintain same meds #2 mild intermittent asthma/allergic rhinitis. Controlled well with Singulair patient to maintain. #3 substantial obesity. Discussed. Patient like to initiate meds plan initiate phentermine once again. Proper use discussed. Blood pressure meds refilled. Singular refill. Diet discussed. Exercise discussed. Recheck in several months/time for wellness that

## 2017-02-12 ENCOUNTER — Encounter: Payer: Self-pay | Admitting: Family Medicine

## 2017-02-12 ENCOUNTER — Ambulatory Visit (INDEPENDENT_AMBULATORY_CARE_PROVIDER_SITE_OTHER): Payer: BC Managed Care – PPO | Admitting: Family Medicine

## 2017-02-12 VITALS — BP 118/72 | Ht 68.0 in | Wt 257.0 lb

## 2017-02-12 DIAGNOSIS — Z0000 Encounter for general adult medical examination without abnormal findings: Secondary | ICD-10-CM | POA: Diagnosis not present

## 2017-02-12 DIAGNOSIS — I10 Essential (primary) hypertension: Secondary | ICD-10-CM

## 2017-02-12 MED ORDER — MONTELUKAST SODIUM 10 MG PO TABS: 10.0000 mg | ORAL_TABLET | Freq: Every day | ORAL | 1 refills | Status: DC

## 2017-02-12 MED ORDER — ENALAPRIL MALEATE 10 MG PO TABS: ORAL_TABLET | ORAL | 1 refills | Status: DC

## 2017-02-12 NOTE — Progress Notes (Signed)
Subjective:    Patient ID: Devon Mora, male    DOB: 09/14/1965, 51 y.o.   MRN: [id number]  HPI The patient comes in today for a wellness visit.    A review of their health history was completed.  A review of medications was also completed.  Any needed refills; bp med and allergy med  Eating habits: health conscious  Falls/  MVA accidents in past few months: none  Regular exercise: walking and cutting wood  Specialist pt sees on regular basis: none  Preventative health issues were discussed.   Additional concerns: none  Declines flu vaccine.   Blood pressure medicine and blood pressure levels reviewed today with patient. Compliant with blood pressure medicine. States does not miss a dose. No obvious side effects. Blood pressure generally good when checked elsewhere. Watching salt intake.  Exercise kind of plus minus at this point  So so with the ex5cise   Pt took phentermine and it irritated stomach so pt quit   Rarely using rescue inhaler now  singulair helping definitely   Results for orders placed or performed in visit on 09/14/15  Lipid panel  Result Value Ref Range   Cholesterol, Total 217 (H) 100 - 199 mg/dL   Triglycerides 290 (H) 0 - 149 mg/dL   HDL 39 (L) >39 mg/dL   VLDL Cholesterol Cal 58 (H) 5 - 40 mg/dL   LDL Calculated 120 (H) 0 - 99 mg/dL   Chol/HDL Ratio 5.6 (H) 0.0 - 5.0 ratio units  Hepatic function panel  Result Value Ref Range   Total Protein 6.9 6.0 - 8.5 g/dL   Albumin 4.5 3.5 - 5.5 g/dL   Bilirubin Total 1.1 0.0 - 1.2 mg/dL   Bilirubin, Direct 0.23 0.00 - 0.40 mg/dL   Alkaline Phosphatase 45 39 - 117 IU/L   AST 19 0 - 40 IU/L   ALT 24 0 - 44 IU/L  Basic metabolic panel  Result Value Ref Range   Glucose 109 (H) 65 - 99 mg/dL   BUN 14 6 - 24 mg/dL   Creatinine, Ser 1.02 0.76 - 1.27 mg/dL   GFR calc non Af Amer 85 >59 mL/min/1.73   GFR calc Af Amer 98 >59 mL/min/1.73   BUN/Creatinine Ratio 14 9 - 20   Sodium 140 134 - 144  mmol/L   Potassium 4.7 3.5 - 5.2 mmol/L   Chloride 101 96 - 106 mmol/L   CO2 25 18 - 29 mmol/L   Calcium 10.0 8.7 - 10.2 mg/dL  PSA  Result Value Ref Range   Prostate Specific Ag, Serum 0.2 0.0 - 4.0 ng/mL    Diet overall not the best with family pace and stress     Review of Systems  Constitutional: Negative for activity change, appetite change and fever.  HENT: Negative for congestion and rhinorrhea.   Eyes: Negative for discharge.  Respiratory: Negative for cough and wheezing.   Cardiovascular: Negative for chest pain.  Gastrointestinal: Negative for abdominal pain, blood in stool and vomiting.  Genitourinary: Negative for difficulty urinating and frequency.  Musculoskeletal: Negative for neck pain.  Skin: Negative for rash.  Allergic/Immunologic: Negative for environmental allergies and food allergies.  Neurological: Negative for weakness and headaches.  Psychiatric/Behavioral: Negative for agitation.       Objective:   Physical Exam  Constitutional: He appears well-developed and well-nourished.  HENT:  Head: Normocephalic and atraumatic.  Right Ear: External ear normal.  Left Ear: External ear normal.  Nose: Nose normal.  Mouth/Throat: Oropharynx is clear and moist.  Eyes: Pupils are equal, round, and reactive to light. EOM are normal.  Neck: Normal range of motion. Neck supple. No thyromegaly present.  Cardiovascular: Normal rate, regular rhythm and normal heart sounds.   No murmur heard. Pulmonary/Chest: Effort normal and breath sounds normal. No respiratory distress. He has no wheezes.  Abdominal: Soft. Bowel sounds are normal. He exhibits no distension and no mass. There is no tenderness.  Genitourinary: Penis normal.  Musculoskeletal: Normal range of motion. He exhibits no edema.  Lymphadenopathy:    He has no cervical adenopathy.  Neurological: He is alert. He exhibits normal muscle tone.  Skin: Skin is warm and dry. No erythema.  Psychiatric: He has a  normal mood and affect. His behavior is normal. Judgment normal.  Vitals reviewed.         Assessment & Plan:  Impression wellness exam. Patient overweight. Encouraged to diet and exercise discussed. Would like to press on with colonoscopy  #2 essential hypertension clinically stable meds to maintain same  #3 history of grief with element of depression anxiety. Patient wishes no medicines at this time.Ongoing challenge for him  #4 asthma clinically stable on Singulair which also helps allergies  GI referral for colonoscopy. Diet exercise discussed. Medications refilled. Follow-up in 6 months

## 2017-02-15 ENCOUNTER — Encounter: Payer: Self-pay | Admitting: Family Medicine

## 2017-02-15 ENCOUNTER — Encounter (INDEPENDENT_AMBULATORY_CARE_PROVIDER_SITE_OTHER): Payer: Self-pay | Admitting: *Deleted

## 2017-02-27 LAB — HEPATIC FUNCTION PANEL
ALT: 33 IU/L (ref 0–44)
AST: 22 IU/L (ref 0–40)
Albumin: 4.6 g/dL (ref 3.5–5.5)
Alkaline Phosphatase: 42 IU/L (ref 39–117)
BILIRUBIN TOTAL: 1.2 mg/dL (ref 0.0–1.2)
Bilirubin, Direct: 0.18 mg/dL (ref 0.00–0.40)
Total Protein: 7.2 g/dL (ref 6.0–8.5)

## 2017-02-27 LAB — PSA: Prostate Specific Ag, Serum: 0.2 ng/mL (ref 0.0–4.0)

## 2017-02-27 LAB — BASIC METABOLIC PANEL
BUN/Creatinine Ratio: 15 (ref 9–20)
BUN: 18 mg/dL (ref 6–24)
CO2: 23 mmol/L (ref 20–29)
CREATININE: 1.17 mg/dL (ref 0.76–1.27)
Calcium: 9.2 mg/dL (ref 8.7–10.2)
Chloride: 100 mmol/L (ref 96–106)
GFR calc Af Amer: 82 mL/min/{1.73_m2} (ref >59)
GFR, EST NON AFRICAN AMERICAN: 71 mL/min/{1.73_m2} (ref >59)
GLUCOSE: 98 mg/dL (ref 65–99)
Potassium: 4.8 mmol/L (ref 3.5–5.2)
SODIUM: 140 mmol/L (ref 134–144)

## 2017-02-27 LAB — LIPID PANEL
CHOL/HDL RATIO: 6.3 ratio — AB (ref 0.0–5.0)
CHOLESTEROL TOTAL: 234 mg/dL — AB (ref 100–199)
HDL: 37 mg/dL — ABNORMAL LOW (ref >39)
LDL CALC: 137 mg/dL — AB (ref 0–99)
Triglycerides: 301 mg/dL — ABNORMAL HIGH (ref 0–149)
VLDL Cholesterol Cal: 60 mg/dL — ABNORMAL HIGH (ref 5–40)

## 2017-03-04 ENCOUNTER — Encounter: Payer: Self-pay | Admitting: Family Medicine

## 2017-08-13 ENCOUNTER — Encounter: Payer: Self-pay | Admitting: Family Medicine

## 2017-08-13 ENCOUNTER — Ambulatory Visit: Payer: BC Managed Care – PPO | Admitting: Family Medicine

## 2017-08-13 ENCOUNTER — Other Ambulatory Visit: Payer: Self-pay | Admitting: Family Medicine

## 2017-08-13 VITALS — BP 120/78 | Ht 68.0 in | Wt 261.2 lb

## 2017-08-13 DIAGNOSIS — R42 Dizziness and giddiness: Secondary | ICD-10-CM

## 2017-08-13 DIAGNOSIS — I10 Essential (primary) hypertension: Secondary | ICD-10-CM

## 2017-08-13 DIAGNOSIS — J4520 Mild intermittent asthma, uncomplicated: Secondary | ICD-10-CM | POA: Diagnosis not present

## 2017-08-13 MED ORDER — MONTELUKAST SODIUM 10 MG PO TABS: 10.0000 mg | ORAL_TABLET | Freq: Every day | ORAL | 1 refills | Status: DC

## 2017-08-13 MED ORDER — DIAZEPAM 5 MG PO TABS: ORAL_TABLET | ORAL | 0 refills | Status: DC

## 2017-08-13 MED ORDER — ENALAPRIL MALEATE 10 MG PO TABS: ORAL_TABLET | ORAL | 1 refills | Status: DC

## 2017-08-13 NOTE — Progress Notes (Signed)
   Subjective:    Patient ID: Devon Mora, male    DOB: May 11, 1965, 52 y.o.   MRN: [id number]  Hypertension  This is a chronic problem. The current episode started more than 1 year ago. Risk factors for coronary artery disease include male gender. Treatments tried: vasotec. There are no compliance problems.    Dr Laural Golden "cancelled on me"  Pt encouraged to make the colonocopy happern   Exercise walking fair amnt, lots of walking at work   BP numbers generally good   Numbers geernally goo,  Overall numbers excellent   Watching diet s as much as possible So far allergies and asthma stable, occas use of albuterol, nowadays getting dust exposure on ocas and using albuterol, uses prn geenrlly at night    Review of Systems  No headache, no major weight loss or weight gain, no chest pain no back pain abdominal pain no change in bowel habits complete ROS otherwise negative     Objective:   Physical Exam  Alert and oriented, vitals reviewed and stable, NAD ENT-TM's and ext canals WNL bilat via otoscopic exam Soft palate, tonsils and post pharynx WNL via oropharyngeal exam Neck-symmetric, no masses; thyroid nonpalpable and nontender Pulmonary-no tachypnea or accessory muscle use; Clear without wheezes via auscultation Card--no abnrml murmurs, rhythm reg and rate WNL Carotid pulses symmetric, without bruits       Assessment & Plan:  Impression 1 hypertension.  Good control.  Discussed.  To maintain same medication  Asthma with element of allergic rhinitis.  Ongoing.  Singulair definitely helps.  Uses pro-air sparingly.  Medications refilled.  Compliance discussed  3.  Vertigo.  Rare flares.  Been difficult when it hits.  Diazepam definitely helps aware of side effects medicine prescription.

## 2017-09-26 ENCOUNTER — Other Ambulatory Visit (HOSPITAL_COMMUNITY): Payer: Self-pay | Admitting: Orthopedic Surgery

## 2017-09-26 DIAGNOSIS — M25511 Pain in right shoulder: Secondary | ICD-10-CM

## 2017-10-02 ENCOUNTER — Ambulatory Visit (HOSPITAL_COMMUNITY)
Admission: RE | Admit: 2017-10-02 | Discharge: 2017-10-02 | Disposition: A | Discharge status: Home / Self Care | Payer: BC Managed Care – PPO | Source: Ambulatory Visit | Attending: Orthopedic Surgery | Admitting: Orthopedic Surgery

## 2017-10-02 DIAGNOSIS — S43491A Other sprain of right shoulder joint, initial encounter: Secondary | ICD-10-CM | POA: Diagnosis not present

## 2017-10-02 DIAGNOSIS — M25511 Pain in right shoulder: Secondary | ICD-10-CM | POA: Insufficient documentation

## 2017-10-02 DIAGNOSIS — M19011 Primary osteoarthritis, right shoulder: Secondary | ICD-10-CM | POA: Insufficient documentation

## 2017-10-02 DIAGNOSIS — M75121 Complete rotator cuff tear or rupture of right shoulder, not specified as traumatic: Secondary | ICD-10-CM | POA: Insufficient documentation

## 2017-10-12 ENCOUNTER — Other Ambulatory Visit: Payer: Self-pay | Admitting: *Deleted

## 2017-10-12 ENCOUNTER — Other Ambulatory Visit: Payer: Self-pay | Admitting: Family Medicine

## 2017-10-12 ENCOUNTER — Telehealth: Payer: Self-pay | Admitting: Family Medicine

## 2017-10-12 MED ORDER — DIAZEPAM 5 MG PO TABS: ORAL_TABLET | ORAL | 0 refills | Status: DC

## 2017-10-12 NOTE — Telephone Encounter (Signed)
Last seen 08/13/17 for vertigo

## 2017-10-12 NOTE — Telephone Encounter (Signed)
Script called into [facility] at [employer]. Pt notified.

## 2017-10-12 NOTE — Telephone Encounter (Signed)
Patient forgot his Valium at home and he is [place] beach.  He wants to know if a small Rx can be called in to get him through the trip.  He takes for vertigo.  [facility] [place] [id number] p - [phone]

## 2017-10-12 NOTE — Telephone Encounter (Signed)
Phone message sent to dr Richardson Landry on this request. Pt is at beach and wanted it sent there. Await response from dr Richardson Landry

## 2017-10-12 NOTE — Telephone Encounter (Signed)
Refill same amnt as before numb 24 same dir as before

## 2017-10-23 ENCOUNTER — Encounter (HOSPITAL_COMMUNITY): Payer: Self-pay | Admitting: Emergency Medicine

## 2017-10-23 ENCOUNTER — Other Ambulatory Visit: Payer: Self-pay

## 2017-10-23 ENCOUNTER — Telehealth: Payer: Self-pay | Admitting: Family Medicine

## 2017-10-23 ENCOUNTER — Emergency Department (HOSPITAL_COMMUNITY): Payer: BC Managed Care – PPO

## 2017-10-23 ENCOUNTER — Emergency Department (HOSPITAL_COMMUNITY)
Admission: EM | Admit: 2017-10-23 | Discharge: 2017-10-23 | Disposition: A | Discharge status: Home / Self Care | Payer: BC Managed Care – PPO | Attending: Emergency Medicine | Admitting: Emergency Medicine

## 2017-10-23 DIAGNOSIS — Z79899 Other long term (current) drug therapy: Secondary | ICD-10-CM | POA: Insufficient documentation

## 2017-10-23 DIAGNOSIS — J986 Disorders of diaphragm: Secondary | ICD-10-CM | POA: Insufficient documentation

## 2017-10-23 DIAGNOSIS — I10 Essential (primary) hypertension: Secondary | ICD-10-CM | POA: Insufficient documentation

## 2017-10-23 DIAGNOSIS — J45909 Unspecified asthma, uncomplicated: Secondary | ICD-10-CM | POA: Insufficient documentation

## 2017-10-23 DIAGNOSIS — R079 Chest pain, unspecified: Secondary | ICD-10-CM | POA: Diagnosis present

## 2017-10-23 LAB — CBC WITH DIFFERENTIAL/PLATELET
BASOS PCT: 0 %
Basophils Absolute: 0 10*3/uL (ref 0.0–0.1)
Eosinophils Absolute: 0 10*3/uL (ref 0.0–0.7)
Eosinophils Relative: 0 %
HCT: 40.8 % (ref 39.0–52.0)
HEMOGLOBIN: 14.1 g/dL (ref 13.0–17.0)
Lymphocytes Relative: 15 %
Lymphs Abs: 2.3 10*3/uL (ref 0.7–4.0)
MCH: 32.3 pg (ref 26.0–34.0)
MCHC: 34.6 g/dL (ref 30.0–36.0)
MCV: 93.6 fL (ref 78.0–100.0)
Monocytes Absolute: 1.6 10*3/uL — ABNORMAL HIGH (ref 0.1–1.0)
Monocytes Relative: 11 %
NEUTROS PCT: 74 %
Neutro Abs: 11.2 10*3/uL — ABNORMAL HIGH (ref 1.7–7.7)
Platelets: 202 10*3/uL (ref 150–400)
RBC: 4.36 MIL/uL (ref 4.22–5.81)
RDW: 12.7 % (ref 11.5–15.5)
WBC: 15.1 10*3/uL — AB (ref 4.0–10.5)

## 2017-10-23 LAB — BASIC METABOLIC PANEL
Anion gap: 10 (ref 5–15)
BUN: 15 mg/dL (ref 6–20)
CALCIUM: 8.9 mg/dL (ref 8.9–10.3)
CO2: 25 mmol/L (ref 22–32)
CREATININE: 0.89 mg/dL (ref 0.61–1.24)
Chloride: 105 mmol/L (ref 98–111)
GFR calc Af Amer: >60 mL/min (ref >60)
Glucose, Bld: 114 mg/dL — ABNORMAL HIGH (ref 70–99)
Potassium: 4.2 mmol/L (ref 3.5–5.1)
SODIUM: 140 mmol/L (ref 135–145)

## 2017-10-23 LAB — TROPONIN I

## 2017-10-23 NOTE — ED Provider Notes (Signed)
[facility] EMERGENCY DEPARTMENT Provider Note   CSN: [id number] Arrival date & time: 10/23/17  1015     History   Chief Complaint Chief Complaint  Patient presents with  . Chest Pain    HPI Devon Mora is a 52 y.o. male.  Patient is a 52 year old male who presents to the emergency department with a complaint of chest discomfort.  The patient had rotator cuff surgery at the surgical [place] on yesterday July 1.  The patient had what sounds like an interscalene brachial plexus block.  The patient states that in recovery he felt as though there was a heavy elephant on his chest, but this gradually improved.  It did not go away completely as he felt through the night he had to make an extra effort to take deep breaths.  He still feels a little tightness in his chest and feels as though he can very easily become short of breath.  The patient had this problem this morning he was told to go to the primary physician or go to the emergency department.  He presents now because of these issues following his procedure.     Past Medical History:  Diagnosis Date  . Allergic rhinitis   . CTS (carpal tunnel syndrome)   . Hyperlipidemia   . Hypertension   . IFG (impaired fasting glucose)   . Obesity     Patient Active Problem List   Diagnosis Date Noted  . Grief 04/16/2015  . Mild intermittent asthma 05/09/2014  . Allergic rhinitis 02/25/2013  . Hypertension   . IFG (impaired fasting glucose)   . Hyperlipidemia     Past Surgical History:  Procedure Laterality Date  . right rotator cuff    . VASECTOMY          Home Medications    Prior to Admission medications   Medication Sig Start Date End Date Taking? Authorizing Provider  albuterol (PROAIR HFA) 108 (90 Base) MCG/ACT inhaler INHALE TWO PUFFS BY MOUTH EVERY 4 HOURS AS NEEDED FOR WHEEZING 08/13/17  Yes Mikey Kirschner, MD  cyclobenzaprine (FLEXERIL) 10 MG tablet Take 1 tablet by mouth every 8 (eight) hours  as needed for muscle spasms. 10/22/17  Yes [provider]  diazepam (VALIUM) 5 MG tablet Take 1 tablet by mouth every 6 hours as needed for vertigo 10/12/17  Yes Mikey Kirschner, MD  enalapril (VASOTEC) 10 MG tablet TAKE ONE (1) TABLET BY MOUTH EVERY DAY 08/13/17  Yes Mikey Kirschner, MD  montelukast (SINGULAIR) 10 MG tablet Take 1 tablet (10 mg total) by mouth at bedtime. 08/13/17  Yes Mikey Kirschner, MD  Multiple Vitamins-Minerals (MULTIVITAMIN WITH MINERALS) tablet Take 1 tablet by mouth daily.   Yes [provider]  oxyCODONE-acetaminophen (PERCOCET/ROXICET) 5-325 MG tablet Take 1-2 tablets by mouth every 4 (four) hours as needed. for pain 10/22/17  Yes [provider]  oxymetazoline (AFRIN NASAL SPRAY) 0.05 % nasal spray Place 1 spray into both nostrils 2 (two) times daily. 08/03/14   Mikey Kirschner, MD    Family History Family History  Problem Relation Age of Onset  . Hypertension Mother   . Cancer Father        Renal cell    Social History Social History   Tobacco Use  . Smoking status: Never Smoker  . Smokeless tobacco: Never Used  Substance Use Topics  . Alcohol use: No  . Drug use: No     Allergies   Dust  mite extract; Phentermine; and Pollen extract   Review of Systems Review of Systems  Constitutional: Negative for activity change.       All ROS Neg except as noted in HPI  HENT: Negative for nosebleeds.   Eyes: Negative for photophobia and discharge.  Respiratory: Positive for cough, chest tightness and shortness of breath. Negative for wheezing.   Cardiovascular: Positive for chest pain. Negative for palpitations and leg swelling.  Gastrointestinal: Negative for abdominal pain and blood in stool.  Genitourinary: Negative for dysuria, frequency and hematuria.  Musculoskeletal: Negative for arthralgias, back pain and neck pain.  Skin: Negative.   Neurological: Negative for dizziness, seizures and speech difficulty.    Psychiatric/Behavioral: Negative for confusion and hallucinations.     Physical Exam Updated Vital Signs BP 123/79   Pulse 62   Temp 98.8 F (37.1 C) (Oral)   Resp 18   SpO2 93%   Physical Exam  Constitutional: He is oriented to person, place, and time. He appears well-developed and well-nourished.  Non-toxic appearance.  HENT:  Head: Normocephalic.  Right Ear: Tympanic membrane and external ear normal.  Left Ear: Tympanic membrane and external ear normal.  Eyes: Pupils are equal, round, and reactive to light. EOM and lids are normal.  Neck: Normal range of motion. Neck supple. Carotid bruit is not present.  Cardiovascular: Normal rate, regular rhythm, normal heart sounds, intact distal pulses and normal pulses.  Pulmonary/Chest: Breath sounds normal. No respiratory distress.  There is slight decreased breath sounds on the right compared to the left.  Patient speaks in complete sentences without problem.  Abdominal: Soft. Bowel sounds are normal. There is no tenderness. There is no guarding.  Musculoskeletal: Normal range of motion.  There is a surgical bandage to the anterior right shoulder.  The area around the bandage shows no increased redness and no drainage.  The radial pulses are 2+ bilaterally, the capillary refill is less than 2 seconds.    Lymphadenopathy:       Head (right side): No submandibular adenopathy present.       Head (left side): No submandibular adenopathy present.    He has no cervical adenopathy.  Neurological: He is alert and oriented to person, place, and time. He has normal strength. No cranial nerve deficit or sensory deficit.  The crypt this week, but the patient can grip.  He states that it hurts if he grips or moves his shoulder too much.  There is no gross neurologic deficits involving the upper extremities.  Skin: Skin is warm and dry.  Psychiatric: He has a normal mood and affect. His speech is normal.  Nursing note and vitals reviewed.    ED  Treatments / Results  Labs (all labs ordered are listed, but only abnormal results are displayed) Labs Reviewed  CBC WITH DIFFERENTIAL/PLATELET - Abnormal; Notable for the following components:      Result Value   WBC 15.1 (*)    Neutro Abs 11.2 (*)    Monocytes Absolute 1.6 (*)    All other components within normal limits  BASIC METABOLIC PANEL  TROPONIN I    EKG EKG Interpretation  Date/Time:  Tuesday October 23 2017 10:43:11 EDT Ventricular Rate:  64 PR Interval:    QRS Duration: 93 QT Interval:  407 QTC Calculation: 420 R Axis:   34 Text Interpretation:  Sinus rhythm Abnormal R-wave progression, early transition No old tracing to compare Confirmed by Daleen Bo [phone] on 10/23/2017 10:49:06 AM   Radiology Dg Chest  Port 1 View  Result Date: 10/23/2017 CLINICAL DATA:  52 year old male 1 day post shoulder surgery with chest pain. Initial encounter. EXAM: PORTABLE CHEST 1 VIEW COMPARISON:  No comparison chest x-rays. FINDINGS: Consolidation right mid to lower lobe may represent elevated right hemidiaphragm with basilar atelectasis. This limits evaluation for the possibility of underlying mass or pleural effusion. Cardiomegaly.  Central pulmonary vascular congestion. No pneumothorax. Right shoulder joint degenerative changes. IMPRESSION: Consolidation right mid to lower lobe suggestive of elevated right hemidiaphragm with basilar atelectasis. This limits evaluation for detection of underlying mass or pleural effusion. Cardiomegaly.  Central pulmonary vascular congestion. Electronically Signed   By: Genia Del M.D.   On: 10/23/2017 11:12    Procedures Procedures (including critical care time)  Medications Ordered in ED Medications - No data to display   Initial Impression / Assessment and Plan / ED Course  I have reviewed the triage vital signs and the nursing notes.  Pertinent labs & imaging results that were available during my care of the patient were reviewed by me and  considered in my medical decision making (see chart for details).       Final Clinical Impressions(s) / ED Diagnoses MDM  Vital signs reviewed.  Pulse oximetry is 93 to 94% on room air.  Within normal limits by my interpretation.  Patient had a procedure on his shoulder with interscalene brachial plexus block.  The patient now has problems with feeling short of breath and feeling as though there is something heavy on his chest or times constricting his chest.  Patient does not require oxygen at this time.  The basic metabolic panel shows the potassium to be 4.2 within normal limits.  No gross or emergent changes involving the basic metabolic panel.  The anion gap is 10. Troponin is less than 0.03.  The electrocardiogram is negative for STEMI, or life-threatening arrhythmia.  Doubt cardiac event.  Chest x-ray shows consolidation in the right mid to lower lobe consistent with an elevated right hemidiaphragm.  There is also noted some bibasilar atelectasis.  Case discussed with Dr. Eulis Foster.  Case discussed with Dr. Tamala Julian, anesthesiology, surgical center in [place].  He confirmed that the patient had a interscalene brachial plexus block, and at times the phrenic nerve may be involved and caused some issues with the diaphragm.  The plan was that if the EKG and troponin were normal and there was no evidence of cardiac event, and that the patient could maintain adequate pulse ox that he can be discharged home.  The anesthesia team at the [facility] who will contact the patient this afternoon and will also follow and track his progress.  I discussed all these findings with the patient, as well as the recommendations from the anesthesia team at [facility].  I have advised the patient to return to the emergency department immediately if the chest pain worsens, shortness of breath worsens, his condition worsens, or he has problems.  Patient and family are in  agreement with this plan.   Final diagnoses:  Diaphragm dysfunction    ED Discharge Orders    None       Lily Kocher, PA-C 10/23/17 [id number]    Daleen Bo, MD 10/23/17 [id number]

## 2017-10-23 NOTE — Telephone Encounter (Signed)
Devon Mora I this is definitely the right recommendation, would have been the right recommendation even if we did have slots, this warrants w u  More extensive than we can do here

## 2017-10-23 NOTE — Telephone Encounter (Signed)
Pt contacted office and stated that he had shoulder surgery yesterday. They put a nerve block in to numb everything. Pt states once he was out of recovery, it felt like an elephant was sitting on his chest. Pt stated he went home and was fine; had a restless night. Pt states that they called to check on his chest pain this morning and patient stated he was still having some trouble; instead of an elephant it feels like a small animal. Surgery center stated to go to Urgent Care or try to get in with PCP. Advised patient we have no availability today and recommend going to Urgent Care or ED. Pt verbalized understanding.

## 2017-10-23 NOTE — Discharge Instructions (Addendum)
Your vital signs are within normal limits.  Your oxygen level is 93% on room air.  Your EKG and heart enzymes are negative for acute heart event.  Your electrolytes and kidney function are all within normal limits.  Your chest x-ray shows your diaphragm to be elevated on the right side.  There is no evidence of a collapsed lung.  I have spoken with the anesthesia team at the surgical center of [facility], and they report that given the type of block that you had for your surgery, that this can occur.  They will be calling you this afternoon to speak with you about your symptoms and monitoring her progress.  As the anesthetic wears off, this should get better.  If your breathing gets worse, or your chest discomfort gets worse, please return to the emergency department immediately.  You should receive a call from surgical center in [place] this afternoon.  Please practice taking deep breaths 4 or  5 times each hour to ensure that your lungs are completely expanding.

## 2017-10-23 NOTE — ED Triage Notes (Signed)
Pt had rotator cuff surgery to right yesterday morning. Pt has bandage applied and arm sling on. Pt had chest pressure and sob yesterday prior to leaving and staff told him the meds for numbing needs to ware off. They called to check on pt this am and pt still c/o of some sob and pressure to mid chest. Pt states feel like ribs are sore. Symptoms only when laying back. None now sitting up. Nad. States right arm still a little numb

## 2017-10-26 ENCOUNTER — Other Ambulatory Visit: Payer: Self-pay

## 2017-10-26 ENCOUNTER — Telehealth (HOSPITAL_COMMUNITY): Payer: Self-pay | Admitting: Occupational Therapy

## 2017-10-26 ENCOUNTER — Ambulatory Visit (HOSPITAL_COMMUNITY): Payer: BC Managed Care – PPO | Attending: Orthopedic Surgery | Admitting: Occupational Therapy

## 2017-10-26 ENCOUNTER — Encounter (HOSPITAL_COMMUNITY): Payer: Self-pay | Admitting: Occupational Therapy

## 2017-10-26 DIAGNOSIS — M25511 Pain in right shoulder: Secondary | ICD-10-CM | POA: Diagnosis present

## 2017-10-26 DIAGNOSIS — R29898 Other symptoms and signs involving the musculoskeletal system: Secondary | ICD-10-CM | POA: Diagnosis present

## 2017-10-26 NOTE — Therapy (Signed)
[place], [place], [address] Phone: [phone]   Fax:  [phone]  Occupational Therapy Evaluation  Patient Details  Name: Devon Mora MRN: [id number] Date of Birth: 03-Sep-1965 Referring Provider: Dr. Elsie Saas   Encounter Date: 10/26/2017  OT End of Session - 10/26/17 1116    Visit Number  1    Number of Visits  24    Date for OT Re-Evaluation  12/25/17 mini-reassessment 11/24/17    Authorization Type  [employer] P[employer]    Authorization Time Period  No visit limit    OT Start Time  1031    OT Stop Time  1108    OT Time Calculation (min)  37 min    Activity Tolerance  Patient tolerated treatment well    Behavior During Therapy  [facility] for tasks assessed/performed       Past Medical History:  Diagnosis Date  . Allergic rhinitis   . CTS (carpal tunnel syndrome)   . Hyperlipidemia   . Hypertension   . IFG (impaired fasting glucose)   . Obesity     Past Surgical History:  Procedure Laterality Date  . right rotator cuff    . VASECTOMY      There were no vitals filed for this visit.  Subjective Assessment - 10/26/17 1112    Subjective   S: I haven't had much pain with it.     Pertinent History  Pt is a 52 y/o male s/p right RTC repair on 10/22/17, procedure was arthroscopic. Pt reports he had 3 tendons (complete) repaired. Dr. Noemi Chapel referred pt to occupational therapy for evaluation and treatment.     Special Tests  FOTO 13/100    Patient Stated Goals  To be able to use my arm again.     Currently in Pain?  Yes    Pain Score  2     Pain Location  Shoulder    Pain Orientation  Right    Pain Descriptors / Indicators  Sore;Tightness    Pain Type  Acute pain    Pain Radiating Towards  n/a    Pain Onset  In the past 7 days    Pain Frequency  Intermittent    Aggravating Factors   movement    Pain Relieving Factors  rest, pain medications    Effect of Pain on Daily Activities  Unable to use RUE  for ADLs    Multiple Pain Sites  No        [facility] OT Assessment - 10/26/17 1026      Assessment   Medical Diagnosis  s/p complex R RTC repair    Referring Provider  Dr. Elsie Saas    Onset Date/Surgical Date  10/22/17    Hand Dominance  Right    Next MD Visit  10/29/17    Prior Therapy  None      Precautions   Precautions  Shoulder    Type of Shoulder Precautions  Sling x6 weeks. P/ROM only for 5 weeks (7/1-8/5); then progress to AA/ROM. See protocol for specifics.     Shoulder Interventions  Shoulder sling/immobilizer;At all times      Balance Screen   Has the patient fallen in the past 6 months  No    Has the patient had a decrease in activity level because of a fear of falling?   No    Is the patient reluctant to leave their home because of a fear of  falling?   No      Prior Function   Level of Independence  Independent    Vocation  Full time employment    Vocation Requirements  schoolbus mechanic-lifting, reaching, stretching, overhead work    Leisure  cut wood, outside work, fishing      ADL   ADL comments  Pt is unable to actively use RUE for ADLs due to recent sx and shoulder precautions      Written Expression   Dominant Hand  Right      Cognition   Overall Cognitive Status  Within Functional Limits for tasks assessed      Observation/Other Assessments   Focus on Therapeutic Outcomes (FOTO)   13/100      ROM / Strength   AROM / PROM / Strength  AROM;PROM;Strength      Palpation   Palpation comment  Mod fascial restrictions in right upper arm, trapezius, and scapularis regions      AROM   Overall AROM   Unable to assess;Due to precautions      PROM   Overall PROM Comments  Assessed supine, er/IR adducted    PROM Assessment Site  Shoulder    Right/Left Shoulder  Right    Right Shoulder Flexion  130 Degrees    Right Shoulder ABduction  102 Degrees    Right Shoulder Internal Rotation  90 Degrees    Right Shoulder External Rotation  0 Degrees       Strength   Overall Strength  Unable to assess;Due to precautions                      OT Education - 10/26/17 1111    Education Details  table slides, pendulums, elbow/wrist A/ROM-begin table slides/pendulums on 7/8    Person(s) Educated  Patient    Methods  Explanation;Demonstration;Handout    Comprehension  Verbalized understanding;Returned demonstration       OT Short Term Goals - 10/26/17 1131      OT SHORT TERM GOAL #1   Title  Pt will be provided with and educated on HEP to improve use of RUE as dominant during ADLs.     Time  6    Period  Weeks    Status  New    Target Date  12/07/17      OT SHORT TERM GOAL #2   Title  Pt will improve RUE P/ROM to [facility] to improve ability to perform dressing tasks.     Time  6    Period  Weeks    Status  New      OT SHORT TERM GOAL #3   Title  Pt will improve RUE strength to 3-/5 to improve ability to reach items at waist to shoulder level.     Time  6    Period  Weeks    Status  New      OT SHORT TERM GOAL #4   Title  Pt will decrease pain in RUE to no greater than 4/10 to improve ability to sleep in the bed.     Time  6    Period  Weeks    Status  New        OT Long Term Goals - 10/26/17 1147      OT LONG TERM GOAL #1   Title  Pt will return to highest level of functioning using RUE as dominant during B/IADLs.     Time  12  Period  Weeks    Status  New    Target Date  01/18/18      OT LONG TERM GOAL #2   Title  Pt will decrease pain in RUE to 2/10 or less to improve ability to perform work tasks using RUE as dominant.     Time  12    Period  Weeks    Status  New      OT LONG TERM GOAL #3   Title  Pt will decrease fascial restrictions in RUE to minimal amounts or less to improve mobility required for functional reaching tasks.     Time  12    Period  Weeks    Status  New      OT LONG TERM GOAL #4   Title  Pt will increase RUE A/ROM to WNL to improve ability to perform overhead reaching tasks.      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     Time  12    Period  Weeks    Status  New            Plan - 10/26/17 1117    Clinical Impression Statement  A: Pt is a 52 y/o male s/p right complex RTC repair on 10/22/17 presenting with functional limitations in using dominant RUE during B/IADLs. Pt educated on HEP to begin on 10/29/17, reviewed elbow/wrist exercises to begin today.     Occupational Profile and client history currently impacting functional performance  Pt is very active prior to sx and is motivated to return to highest level of functioning in all tasks.     Occupational performance deficits (Please refer to evaluation for details):  ADL's;IADL's;Rest and Sleep;Work;Leisure    Rehab Potential  Good    OT Frequency  2x / week    OT Duration  12 weeks    OT Treatment/Interventions  Self-care/ADL training;Therapeutic exercise;Ultrasound;Manual Therapy;Therapeutic activities;Cryotherapy;Electrical Stimulation;Moist Heat;Passive range of motion;Patient/family education    Plan  P: Pt will benefit from skilled OT services to decrease pain and fascial restrictions, increase ROM, strength, and improve functional use of RUE as dominant. Treatment plan: myofascial release, manual therapy, P/ROM, AA/ROM, A/ROM, scapular stability and strengthening, general RUE strengthening, modalities prn    Clinical Decision Making  Limited treatment options, no task modification necessary    OT Home Exercise Plan  7/5: table slides, pendulums, elbow/wrist A/ROM-beginning table slides and pendulums on 7/8    Consulted and Agree with Plan of Care  Patient       Patient will benefit from skilled therapeutic intervention in order to improve the following deficits and impairments:  Decreased activity tolerance, Decreased strength, Impaired flexibility,  Decreased range of motion, Pain, Increased fascial restrictions, Impaired UE functional use  Visit Diagnosis: Acute pain of right shoulder  Other symptoms and signs involving the musculoskeletal system    Problem List Patient Active Problem List   Diagnosis Date Noted  . Grief 04/16/2015  . Mild intermittent asthma 05/09/2014  . Allergic rhinitis 02/25/2013  . Hypertension   . IFG (impaired fasting glucose)   . Hyperlipidemia    Guadelupe Sabin, OTR/L  [phone] 10/26/2017, 12:05 PM  [place], [place],  [place] Phone: [phone]   Fax:  [phone]  Name: CRESTON KLAS MRN: [id number] Date of Birth: 06-21-1965

## 2017-10-26 NOTE — Patient Instructions (Signed)
TOWEL SLIDES COMPLETE FOR 1-3 MINUTES, 3-5 TIMES PER DAY  SHOULDER: Flexion On Table   Place hands on table, elbows straight. Move hips away from body. Press hands down into table. __10_ reps per set, __3_ sets per day  Abduction (Passive)   With arm out to side, resting on table, lower head toward arm, keeping trunk away from table.  Repeat __10__ times. Do __3__ sessions per day.  Copyright  VHI. All rights reserved.     Internal Rotation (Assistive)   Seated with elbow bent at right angle and held against side, slide arm on table surface in an inward arc. Repeat __10__ times. Do __3__ sessions per day. Activity: Use this motion to brush crumbs off the table.  Copyright  VHI. All rights reserved.    COMPLETE PENDULUM EXERCISES FOR 30 SECONDS TO A MINUTE EACH, 3-5 TIMES PER DAY. ROM: Pendulum (Side-to-Side)   Bend forward 90 at waist, using table for support. Rock body side to side to swing arm. Repeat 10-15X, 3x/day  Pendulum Forward/Back   Bend forward 90 at waist, using table for support. Rock body forward and back to swing arm. Repeat __10-15__ times. Do __3__ sessions per day.  Copyright  VHI. All rights reserved.  Pendulum Circular   Bend forward 90 at waist, leaning on table for support. Rock body in a circular pattern to move arm clockwise __10__ times then counterclockwise __10__ times. Do __3__ sessions per day.  Copyright  VHI. All rights reserved.  AROM: Wrist Extension   With right palm down, bend wrist up. Repeat 10____ times per set. Do __1__ sets per session. Do __3__ sessions per day.  Copyright  VHI. All rights reserved.   AROM: Wrist Flexion   With right palm up, bend wrist up. Repeat ___10_ times per set. Do _1___ sets per session. Do __3__ sessions per day.  Copyright  VHI. All rights reserved.   AROM: Forearm Pronation / Supination   With right arm in handshake position, slowly rotate palm down until stretch is felt.  Relax. Then rotate palm up until stretch is felt. Repeat __10__ times per set. Do ___1_ sets per session. Do __3__ sessions per day.  Copyright  VHI. All rights reserved.   AFlexion (Passive)   Use other hand to bend elbow, with thumb toward same shoulder. Do NOT force this motion. Repeat _10-15___ times. Do __3__ sessions per day.  Copyright  VHI. All rights reserved.

## 2017-10-26 NOTE — Telephone Encounter (Signed)
Pt verifed that he saw Dr. Cindi Carbon. NF 10/24/17 Patient states he has no problem starting today- L/M at [facility]  office waiting for return phone call> NF [phone]

## 2017-10-29 ENCOUNTER — Encounter (HOSPITAL_COMMUNITY): Payer: Self-pay | Admitting: Specialist

## 2017-10-29 ENCOUNTER — Ambulatory Visit (HOSPITAL_COMMUNITY): Payer: BC Managed Care – PPO | Admitting: Specialist

## 2017-10-29 DIAGNOSIS — M25511 Pain in right shoulder: Secondary | ICD-10-CM

## 2017-10-29 DIAGNOSIS — R29898 Other symptoms and signs involving the musculoskeletal system: Secondary | ICD-10-CM

## 2017-10-29 NOTE — Therapy (Signed)
[place], [place], [address] Phone: [phone]   Fax:  [phone]  Occupational Therapy Treatment  Patient Details  Name: Devon Mora MRN: [id number] Date of Birth: 07-24-1965 Referring Provider: Dr. Elsie Saas   Encounter Date: 10/29/2017  OT End of Session - 10/29/17 1542    Visit Number  2    Number of Visits  24    Date for OT Re-Evaluation  12/25/17 mini reassess on 11/24/17    Authorization Type  [place] PPO    Authorization Time Period  No visit limit    OT Start Time  0105    OT Stop Time  0140    OT Time Calculation (min)  35 min    Activity Tolerance  Patient tolerated treatment well    Behavior During Therapy  [facility] for tasks assessed/performed       Past Medical History:  Diagnosis Date  . Allergic rhinitis   . CTS (carpal tunnel syndrome)   . Hyperlipidemia   . Hypertension   . IFG (impaired fasting glucose)   . Obesity     Past Surgical History:  Procedure Laterality Date  . right rotator cuff    . VASECTOMY      There were no vitals filed for this visit.  Subjective Assessment - 10/29/17 1541    Subjective   S:  I have been doing pretty well.  It doesnt hurt too much.      Currently in Pain?  Yes    Pain Score  4     Pain Location  Shoulder    Pain Orientation  Right    Pain Descriptors / Indicators  Aching;Tightness    Pain Type  Acute pain         [facility] OT Assessment - 10/29/17 0001      Assessment   Medical Diagnosis  s/p complex R RTC repair    Referring Provider  Dr. Elsie Saas      Precautions   Precautions  Shoulder    Type of Shoulder Precautions  Sling x6 weeks. P/ROM only for 5 weeks (7/1-8/5); then progress to AA/ROM. See protocol for specifics.     Shoulder Interventions  Shoulder sling/immobilizer;At all times               OT Treatments/Exercises (OP) - 10/29/17 0001      Exercises   Exercises  Shoulder      Shoulder  Exercises: Supine   Protraction  PROM;5 reps    External Rotation  PROM;5 reps    Internal Rotation  PROM;5 reps    Flexion  PROM;5 reps    ABduction  PROM;5 reps      Shoulder Exercises: Seated   Elevation  AROM;10 reps    Extension  AROM;10 reps    Row  AROM;10 reps      Shoulder Exercises: Therapy Ball   Flexion  10 reps    ABduction  10 reps      Shoulder Exercises: ROM/Strengthening   Thumb Tacks  low, elbows by side, 1 minute     Prot/Ret//Elev/Dep  low, elbows by side, 1 minute       Shoulder Exercises: Isometric Strengthening   Flexion  Supine;3X3"    Extension  Supine;3X3"    External Rotation  Supine;3X3"    Internal Rotation  Supine;3X3"    ABduction  Supine;3X3"    ADduction  Supine;3X3"      Manual Therapy  Manual Therapy  Myofascial release    Manual therapy comments  manual therapy completed seperately from all other interventions    Myofascial Release  myofascial release and manual stretching to right upper arm, scapular, and shoudler region to decrease pain and fascial restrctions and improve pain free mobility in right shoulder region required for use of right arm with functional activities.              OT Education - 10/29/17 1541    Education Details  reviewed plan of care and issued patient a copy    Person(s) Educated  Patient    Methods  Explanation;Demonstration;Handout    Comprehension  Verbalized understanding       OT Short Term Goals - 10/29/17 1543      OT SHORT TERM GOAL #1   Title  Pt will be provided with and educated on HEP to improve use of RUE as dominant during ADLs.     Time  6    Period  Weeks    Status  On-going      OT SHORT TERM GOAL #2   Title  Pt will improve RUE P/ROM to [facility] to improve ability to perform dressing tasks.     Time  6    Period  Weeks    Status  On-going      OT SHORT TERM GOAL #3   Title  Pt will improve RUE strength to 3-/5 to improve ability to reach items at waist to shoulder level.      Time  6    Period  Weeks    Status  On-going      OT SHORT TERM GOAL #4   Title  Pt will decrease pain in RUE to no greater than 4/10 to improve ability to sleep in the bed.     Time  6    Period  Weeks    Status  On-going        OT Long Term Goals - 10/29/17 1544      OT LONG TERM GOAL #1   Title  Pt will return to highest level of functioning using RUE as dominant during B/IADLs.     Time  12    Period  Weeks    Status  On-going      OT LONG TERM GOAL #2   Title  Pt will decrease pain in RUE to 2/10 or less to improve ability to perform work tasks using RUE as dominant.     Time  12    Period  Weeks    Status  On-going      OT LONG TERM GOAL #3   Title  Pt will decrease fascial restrictions in RUE to minimal amounts or less to improve mobility required for functional reaching tasks.     Time  12    Period  Weeks    Status  On-going      OT LONG TERM GOAL #4   Title  Pt will increase RUE A/ROM to WNL to improve ability to perform overhead reaching tasks.     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     Time  12    Period  Weeks    Status  On-going            Plan - 10/29/17 1542    Clinical Impression Statement  A:  Patient tolerated P/ROM and manual therapy intervention well.  Added isometric strengthening in supine with good form demonstrated by patient.     Plan  P:  Continue P/ROM to improve to [facility] range, increase contraction time to 5" with isometric strengthening.        Patient will benefit from skilled therapeutic intervention in order to improve the following deficits and impairments:  Decreased activity tolerance, Decreased strength, Impaired flexibility, Decreased range of motion, Pain, Increased fascial restrictions, Impaired UE functional use  Visit Diagnosis: Other symptoms and signs  involving the musculoskeletal system  Acute pain of right shoulder    Problem List Patient Active Problem List   Diagnosis Date Noted  . Grief 04/16/2015  . Mild intermittent asthma 05/09/2014  . Allergic rhinitis 02/25/2013  . Hypertension   . IFG (impaired fasting glucose)   . Hyperlipidemia     Vangie Bicker, [place], OTR/L [phone]  10/29/2017, 3:48 PM  [place] [address] [place], [place], [address] Phone: [phone]   Fax:  [phone]  Name: Devon Mora MRN: [id number] Date of Birth: Dec 03, 1965

## 2017-10-31 ENCOUNTER — Encounter (HOSPITAL_COMMUNITY): Payer: Self-pay | Admitting: Occupational Therapy

## 2017-10-31 ENCOUNTER — Ambulatory Visit (HOSPITAL_COMMUNITY): Payer: BC Managed Care – PPO | Admitting: Occupational Therapy

## 2017-10-31 DIAGNOSIS — R29898 Other symptoms and signs involving the musculoskeletal system: Secondary | ICD-10-CM

## 2017-10-31 DIAGNOSIS — M25511 Pain in right shoulder: Secondary | ICD-10-CM | POA: Diagnosis not present

## 2017-10-31 NOTE — Therapy (Signed)
[place], [place], [address] Phone: [phone]   Fax:  [phone]  Occupational Therapy Treatment  Patient Details  Name: Devon Mora MRN: [id number] Date of Birth: [birth year] Referring Provider: Dr. Elsie Saas   Encounter Date: 10/31/2017  OT End of Session - 10/31/17 1342    Visit Number  3    Number of Visits  24    Date for OT Re-Evaluation  12/25/17 mini reassess on 11/24/17    Authorization Type  [employer] P[employer]    Authorization Time Period  No visit limit    OT Start Time  1301    OT Stop Time  1340    OT Time Calculation (min)  39 min    Activity Tolerance  Patient tolerated treatment well    Behavior During Therapy  [facility] for tasks assessed/performed       Past Medical History:  Diagnosis Date  . Allergic rhinitis   . CTS (carpal tunnel syndrome)   . Hyperlipidemia   . Hypertension   . IFG (impaired fasting glucose)   . Obesity     Past Surgical History:  Procedure Laterality Date  . right rotator cuff    . VASECTOMY      There were no vitals filed for this visit.  Subjective Assessment - 10/31/17 1300    Subjective   S: The doctor released me to drive.     Currently in Pain?  No/denies         [facility] OT Assessment - 10/31/17 1300      Assessment   Medical Diagnosis  s/p complex R RTC repair      Precautions   Precautions  Shoulder    Type of Shoulder Precautions  Sling x6 weeks. P/ROM only for 5 weeks (7/1-8/5); then progress to AA/ROM. See protocol for specifics.     Shoulder Interventions  Shoulder sling/immobilizer;At all times               OT Treatments/Exercises (OP) - 10/31/17 1304      Exercises   Exercises  Shoulder      Shoulder Exercises: Supine   Protraction  PROM;10 reps    External Rotation  PROM;10 reps    External Rotation Limitations  30 degrees per protocol     Internal Rotation  PROM;10 reps    Flexion  PROM;10 reps    ABduction   PROM;10 reps    ABduction Limitations  70 degrees per protocol       Shoulder Exercises: Seated   Elevation  AROM;10 reps    Extension  AROM;10 reps    Row  AROM;10 reps    Other Seated Exercises  shoulder depression: 10X      Shoulder Exercises: Therapy Ball   Flexion  10 reps    ABduction  10 reps      Shoulder Exercises: ROM/Strengthening   Anterior Glide  3X5"    Caudal Glide  3X5"    Other ROM/Strengthening Exercises  distraction with towel roll: 3X5"      Shoulder Exercises: Isometric Strengthening   Flexion  Supine;3X3"    Extension  Supine;3X3"    External Rotation  Supine;3X3"    Internal Rotation  Supine;3X3"    ABduction  Supine;3X3"    ADduction  Supine;3X3"      Manual Therapy   Manual Therapy  Myofascial release    Manual therapy comments  manual therapy completed seperately from all other interventions  Myofascial Release  myofascial release and manual stretching to right upper arm, scapular, and shoudler region to decrease pain and fascial restrctions and improve pain free mobility in right shoulder region required for use of right arm with functional activities.                OT Short Term Goals - 10/29/17 1543      OT SHORT TERM GOAL #1   Title  Pt will be provided with and educated on HEP to improve use of RUE as dominant during ADLs.     Time  6    Period  Weeks    Status  On-going      OT SHORT TERM GOAL #2   Title  Pt will improve RUE P/ROM to [facility] to improve ability to perform dressing tasks.     Time  6    Period  Weeks    Status  On-going      OT SHORT TERM GOAL #3   Title  Pt will improve RUE strength to 3-/5 to improve ability to reach items at waist to shoulder level.     Time  6    Period  Weeks    Status  On-going      OT SHORT TERM GOAL #4   Title  Pt will decrease pain in RUE to no greater than 4/10 to improve ability to sleep in the bed.     Time  6    Period  Weeks    Status  On-going        OT Long Term Goals -  10/29/17 1544      OT LONG TERM GOAL #1   Title  Pt will return to highest level of functioning using RUE as dominant during B/IADLs.     Time  12    Period  Weeks    Status  On-going      OT LONG TERM GOAL #2   Title  Pt will decrease pain in RUE to 2/10 or less to improve ability to perform work tasks using RUE as dominant.     Time  12    Period  Weeks    Status  On-going      OT LONG TERM GOAL #3   Title  Pt will decrease fascial restrictions in RUE to minimal amounts or less to improve mobility required for functional reaching tasks.     Time  12    Period  Weeks    Status  On-going      OT LONG TERM GOAL #4   Title  Pt will increase RUE A/ROM to WNL to improve ability to perform overhead reaching tasks.     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     Time  12    Period  Weeks    Status  On-going            Plan - 10/31/17 1342    Clinical Impression Statement  A: Continued with manual therapy to address fascial restrictions, muscle knot palpated along medial border of scapula today. Continued with P/ROM progressing flexion as able to tolerate, improvement with er noted since evaluation. Added anterior and caudal gliding today, as well as shoulder depression  during scapular ROM exercises. Verbal cuing for form and technique.     Plan  P: continue to improve P/ROM flexion as able to tolerate, abduction to 90 per protocol. increase isometric contractions to 5"       Patient will benefit from skilled therapeutic intervention in order to improve the following deficits and impairments:  Decreased activity tolerance, Decreased strength, Impaired flexibility, Decreased range of motion, Pain, Increased fascial restrictions, Impaired UE functional use  Visit Diagnosis: Other symptoms and signs involving the  musculoskeletal system  Acute pain of right shoulder    Problem List Patient Active Problem List   Diagnosis Date Noted  . Grief 04/16/2015  . Mild intermittent asthma 05/09/2014  . Allergic rhinitis 02/25/2013  . Hypertension   . IFG (impaired fasting glucose)   . Hyperlipidemia    Guadelupe Sabin, OTR/L  [phone] 10/31/2017, 2:06 PM  [place] [address] [place], [place], [address] Phone: [phone]   Fax:  [phone]  Name: DAKARAI MCGLOCKLIN MRN: [id number] Date of Birth: [birth year]

## 2017-11-05 ENCOUNTER — Ambulatory Visit (HOSPITAL_COMMUNITY): Payer: BC Managed Care – PPO

## 2017-11-05 ENCOUNTER — Encounter (HOSPITAL_COMMUNITY): Payer: Self-pay

## 2017-11-05 ENCOUNTER — Other Ambulatory Visit: Payer: Self-pay

## 2017-11-05 DIAGNOSIS — M25511 Pain in right shoulder: Secondary | ICD-10-CM

## 2017-11-05 DIAGNOSIS — R29898 Other symptoms and signs involving the musculoskeletal system: Secondary | ICD-10-CM

## 2017-11-05 NOTE — Therapy (Signed)
[place], [place], [address] Phone: [phone]   Fax:  [phone]  Occupational Therapy Treatment  Patient Details  Name: Devon Mora MRN: [id number] Date of Birth: Jul 12, 1965 Referring Provider: Dr. Elsie Saas   Encounter Date: 11/05/2017  OT End of Session - 11/05/17 1550    Visit Number  4    Number of Visits  24    Date for OT Re-Evaluation  12/25/17 mini reassess on 11/24/17    Authorization Type  [place] PPO    Authorization Time Period  No visit limit    OT Start Time  1519    OT Stop Time  1559    OT Time Calculation (min)  40 min    Activity Tolerance  Patient tolerated treatment well    Behavior During Therapy  [facility] for tasks assessed/performed       Past Medical History:  Diagnosis Date  . Allergic rhinitis   . CTS (carpal tunnel syndrome)   . Hyperlipidemia   . Hypertension   . IFG (impaired fasting glucose)   . Obesity     Past Surgical History:  Procedure Laterality Date  . right rotator cuff    . VASECTOMY      There were no vitals filed for this visit.  Subjective Assessment - 11/05/17 1522    Subjective   S: I got out of the recliner and I think I pinched something in the neck.    Currently in Pain?  No/denies         [facility] OT Assessment - 11/05/17 1519      Assessment   Medical Diagnosis  s/p complex R RTC repair      Precautions   Precautions  Shoulder    Type of Shoulder Precautions  Sling x6 weeks. P/ROM only for 5 weeks (7/1-8/5); then progress to AA/ROM. See protocol for specifics.     Shoulder Interventions  Shoulder sling/immobilizer;At all times               OT Treatments/Exercises (OP) - 11/05/17 1519      Exercises   Exercises  Shoulder      Shoulder Exercises: Supine   Protraction  PROM;10 reps    External Rotation  PROM;10 reps    External Rotation Limitations  30 degrees per protocol     Internal Rotation  PROM;10 reps    Flexion  PROM;10 reps    ABduction  PROM;10 reps    ABduction Limitations  90 degrees per protocol       Shoulder Exercises: Seated   Elevation  AROM;10 reps    Extension  AROM;10 reps    Row  AROM;10 reps    Other Seated Exercises  shoulder depression: 10X      Shoulder Exercises: Therapy Ball   Flexion  10 reps stretches    ABduction  10 reps stretches      Shoulder Exercises: ROM/Strengthening   Anterior Glide  3X10"    Caudal Glide  3X10"      Shoulder Exercises: Isometric Strengthening   Flexion  Supine;5X5"    Extension  Supine;5X5"    External Rotation  Supine;5X5"    Internal Rotation  Supine;5X5"    ABduction  Supine;5X5"    ADduction  Supine;5X5"      Manual Therapy   Manual Therapy  Myofascial release    Manual therapy comments  manual therapy completed seperately from all other interventions  Myofascial Release  myofascial release and manual stretching to right upper arm, scapular, and shoudler region to decrease pain and fascial restrctions and improve pain free mobility in right shoulder region required for use of right arm with functional activities.                OT Short Term Goals - 10/29/17 1543      OT SHORT TERM GOAL #1   Title  Pt will be provided with and educated on HEP to improve use of RUE as dominant during ADLs.     Time  6    Period  Weeks    Status  On-going      OT SHORT TERM GOAL #2   Title  Pt will improve RUE P/ROM to [facility] to improve ability to perform dressing tasks.     Time  6    Period  Weeks    Status  On-going      OT SHORT TERM GOAL #3   Title  Pt will improve RUE strength to 3-/5 to improve ability to reach items at waist to shoulder level.     Time  6    Period  Weeks    Status  On-going      OT SHORT TERM GOAL #4   Title  Pt will decrease pain in RUE to no greater than 4/10 to improve ability to sleep in the bed.     Time  6    Period  Weeks    Status  On-going        OT Long Term Goals - 10/29/17 1544       OT LONG TERM GOAL #1   Title  Pt will return to highest level of functioning using RUE as dominant during B/IADLs.     Time  12    Period  Weeks    Status  On-going      OT LONG TERM GOAL #2   Title  Pt will decrease pain in RUE to 2/10 or less to improve ability to perform work tasks using RUE as dominant.     Time  12    Period  Weeks    Status  On-going      OT LONG TERM GOAL #3   Title  Pt will decrease fascial restrictions in RUE to minimal amounts or less to improve mobility required for functional reaching tasks.     Time  12    Period  Weeks    Status  On-going      OT LONG TERM GOAL #4   Title  Pt will increase RUE A/ROM to WNL to improve ability to perform overhead reaching tasks.     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     Time  12    Period  Weeks    Status  On-going            Plan - 11/05/17 1554    Clinical Impression Statement  A: Continued with manual therapy to address fascial restrictions. Moderate fascial restrictions palpated in anterior upper arm and upper trapezius regions. Patient able to increase time for isometric exercises this session and performed well with minimal pain. Able to increase P/ROM abduction to 90  degrees per protocol this session. Patient is demonstrating P/ROM for flexion WNL after first couple reps.    Plan  P: Continue to improve P/ROM per protocol guidelines. Increase isometric contraction time to 10".       Patient will benefit from skilled therapeutic intervention in order to improve the following deficits and impairments:  Decreased activity tolerance, Decreased strength, Impaired flexibility, Decreased range of motion, Pain, Increased fascial restrictions, Impaired UE functional use  Visit Diagnosis: Other symptoms and signs involving the  musculoskeletal system  Acute pain of right shoulder    Problem List Patient Active Problem List   Diagnosis Date Noted  . Grief 04/16/2015  . Mild intermittent asthma 05/09/2014  . Allergic rhinitis 02/25/2013  . Hypertension   . IFG (impaired fasting glucose)   . Hyperlipidemia     Roderic Palau, OT student 11/05/2017, 4:10 PM  [place] [address] [place], [place], [address] Phone: [phone]   Fax:  [phone]  Name: Devon Mora MRN: [id number] Date of Birth: 05-12-1965

## 2017-11-07 ENCOUNTER — Ambulatory Visit (HOSPITAL_COMMUNITY): Payer: BC Managed Care – PPO | Admitting: Occupational Therapy

## 2017-11-07 ENCOUNTER — Encounter (HOSPITAL_COMMUNITY): Payer: Self-pay | Admitting: Occupational Therapy

## 2017-11-07 DIAGNOSIS — M25511 Pain in right shoulder: Secondary | ICD-10-CM

## 2017-11-07 DIAGNOSIS — R29898 Other symptoms and signs involving the musculoskeletal system: Secondary | ICD-10-CM

## 2017-11-07 NOTE — Therapy (Signed)
[place], [place], [address] Phone: [phone]   Fax:  [phone]  Occupational Therapy Treatment  Patient Details  Name: Devon Mora MRN: [id number] Date of Birth: February 06, 1966 Referring Provider: Dr. Elsie Saas   Encounter Date: 11/07/2017  OT End of Session - 11/07/17 1343    Visit Number  5    Number of Visits  24    Date for OT Re-Evaluation  12/25/17 mini reassess on 11/24/17    Authorization Type  [place] PPO    Authorization Time Period  No visit limit    OT Start Time  1300    OT Stop Time  1341    OT Time Calculation (min)  41 min    Activity Tolerance  Patient tolerated treatment well    Behavior During Therapy  [facility] for tasks assessed/performed       Past Medical History:  Diagnosis Date  . Allergic rhinitis   . CTS (carpal tunnel syndrome)   . Hyperlipidemia   . Hypertension   . IFG (impaired fasting glucose)   . Obesity     Past Surgical History:  Procedure Laterality Date  . right rotator cuff    . VASECTOMY      There were no vitals filed for this visit.  Subjective Assessment - 11/07/17 1258    Subjective   S: I'm only taking the muscle relaxers now, no pain medication.    Currently in Pain?  No/denies         [facility] OT Assessment - 11/07/17 1258      Assessment   Medical Diagnosis  s/p complex R RTC repair      Precautions   Precautions  Shoulder    Type of Shoulder Precautions  Sling x6 weeks. P/ROM only for 5 weeks (7/1-8/5); then progress to AA/ROM. See protocol for specifics.     Shoulder Interventions  Shoulder sling/immobilizer;At all times               OT Treatments/Exercises (OP) - 11/07/17 1302      Exercises   Exercises  Shoulder      Shoulder Exercises: Supine   Protraction  PROM;10 reps    Horizontal ABduction  PROM;10 reps    External Rotation  PROM;10 reps    External Rotation Limitations  30 degrees per protocol      Internal Rotation  PROM;10 reps    Flexion  PROM;10 reps    ABduction  PROM;10 reps    ABduction Limitations  90 degrees per protocol       Shoulder Exercises: Seated   Elevation  AROM;15 reps    Extension  AROM;15 reps    Row  AROM;15 reps    Other Seated Exercises  shoulder depression: 15X      Shoulder Exercises: Therapy Ball   Flexion  -- 12 reps    ABduction  -- 12 reps      Shoulder Exercises: ROM/Strengthening   Thumb Tacks  low, elbows by side, 1 minute     Anterior Glide  3X10"    Prot/Ret//Elev/Dep  low, elbows by side, 1 minute       Shoulder Exercises: Isometric Strengthening   Flexion  -- 3X10" standing    Extension  -- 3X10" standing    External Rotation  -- 3X10" standing    Internal Rotation  -- 3X10" standing    ABduction  -- 3X10" standing    ADduction  --  3X10" standing      Manual Therapy   Manual Therapy  Myofascial release    Manual therapy comments  manual therapy completed seperately from all other interventions    Myofascial Release  myofascial release and manual stretching to right upper arm, scapular, and shoudler region to decrease pain and fascial restrctions and improve pain free mobility in right shoulder region required for use of right arm with functional activities.              OT Education - 11/07/17 1329    Education Details  isometrics    Person(s) Educated  Patient    Methods  Explanation;Demonstration;Handout    Comprehension  Verbalized understanding;Returned demonstration       OT Short Term Goals - 10/29/17 1543      OT SHORT TERM GOAL #1   Title  Pt will be provided with and educated on HEP to improve use of RUE as dominant during ADLs.     Time  6    Period  Weeks    Status  On-going      OT SHORT TERM GOAL #2   Title  Pt will improve RUE P/ROM to [facility] to improve ability to perform dressing tasks.     Time  6    Period  Weeks    Status  On-going      OT SHORT TERM GOAL #3   Title  Pt will improve RUE strength  to 3-/5 to improve ability to reach items at waist to shoulder level.     Time  6    Period  Weeks    Status  On-going      OT SHORT TERM GOAL #4   Title  Pt will decrease pain in RUE to no greater than 4/10 to improve ability to sleep in the bed.     Time  6    Period  Weeks    Status  On-going        OT Long Term Goals - 10/29/17 1544      OT LONG TERM GOAL #1   Title  Pt will return to highest level of functioning using RUE as dominant during B/IADLs.     Time  12    Period  Weeks    Status  On-going      OT LONG TERM GOAL #2   Title  Pt will decrease pain in RUE to 2/10 or less to improve ability to perform work tasks using RUE as dominant.     Time  12    Period  Weeks    Status  On-going      OT LONG TERM GOAL #3   Title  Pt will decrease fascial restrictions in RUE to minimal amounts or less to improve mobility required for functional reaching tasks.     Time  12    Period  Weeks    Status  On-going      OT LONG TERM GOAL #4   Title  Pt will increase RUE A/ROM to WNL to improve ability to perform overhead reaching tasks.     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     Time  12    Period  Weeks  Status  On-going            Plan - 11/07/17 1343    Clinical Impression Statement  A: Continued with manual therapy to address fascial restrictions in anterior shoulder and scapular regions. Progressed to standing isometrics and added to HEP, pt able to complete with minimal difficulty. Continued with passive stretching within protocol limits, added horizontal abduction. Also added low thumb tacks and low prot/ret/elev/dep. Verbal cuing for form and technique.     Plan  P: Continue with P/ROM working to improve flexion range, follow up on isometric HEP and continue with 3X10" in standing.         Patient will benefit from skilled therapeutic intervention in order to improve the following deficits and impairments:  Decreased activity tolerance, Decreased strength, Impaired flexibility, Decreased range of motion, Pain, Increased fascial restrictions, Impaired UE functional use  Visit Diagnosis: Other symptoms and signs involving the musculoskeletal system  Acute pain of right shoulder    Problem List Patient Active Problem List   Diagnosis Date Noted  . Grief 04/16/2015  . Mild intermittent asthma 05/09/2014  . Allergic rhinitis 02/25/2013  . Hypertension   . IFG (impaired fasting glucose)   . Hyperlipidemia    Guadelupe Sabin, OTR/L  [phone] 11/07/2017, 1:46 PM  [place] [address] [place], [place], [address] Phone: [phone]   Fax:  [phone]  Name: SAKARI ALKHATIB MRN: [id number] Date of Birth: 1966/04/03

## 2017-11-07 NOTE — Patient Instructions (Signed)
Shoulder Isometric Exercises  Complete 5x each, holding for 10 seconds. Complete 1-2x/day.   1) Shoulder Flexion Standing up, place a towel between your arm/wrist and the wall. Press your arm forward into the wall gently, without moving your shoulder.     2) Shoulder Extension Standing with your back against a wall. Bend elbow to 90 degrees and place a towel between your bent elbow and the wall.  Press your elbow back into the wall gently, without moving your shoulder.        3) Shoulder Internal Rotation Standing just behind a doorway, place your affected hand inside the doorway so that your palm is facing the door jam.  Keeping your elbow at your side and bend to 90 degrees, slowly press your hand into the door jam.  Do not move shoulder or allow elbow to leave your side.     4) Shoulder External Rotation Standing in a doorway or to the side of a wall, Place the affected hand/wrist against the wall.  Keeping your elbow bent at 90 degrees and close to your body, press your hand/wrist outward against the wall.  Do not move your shoulder or elbow.          5) Shoulder Adduction Standing just behind a doorway, place a towel between the inside of your elbow and the doorframe. Keeping your elbow bent at 90 degrees and close to your side, gently press your elbow into the doorframe.  Do not move shoulder.     6) Shoulder Abduction Standing to the side of a wall, place a towel between your arm and the wall.  Press your arm into the wall gently.  Do not move your shoulder.

## 2017-11-12 ENCOUNTER — Ambulatory Visit (HOSPITAL_COMMUNITY): Payer: BC Managed Care – PPO | Admitting: Specialist

## 2017-11-12 ENCOUNTER — Encounter (HOSPITAL_COMMUNITY): Payer: Self-pay | Admitting: Specialist

## 2017-11-12 DIAGNOSIS — R29898 Other symptoms and signs involving the musculoskeletal system: Secondary | ICD-10-CM

## 2017-11-12 DIAGNOSIS — M25511 Pain in right shoulder: Secondary | ICD-10-CM

## 2017-11-12 NOTE — Therapy (Signed)
[place], [place], [address] Phone: [phone]   Fax:  [phone]  Occupational Therapy Treatment  Patient Details  Name: Devon Mora MRN: [id number] Date of Birth: [birth year] Referring Provider: Dr. Elsie Saas   Encounter Date: 11/12/2017  OT End of Session - 11/12/17 1650    Visit Number  6    Number of Visits  24    Date for OT Re-Evaluation  12/25/17 mini reassess on 8/3    Authorization Type  [place] PPO    Authorization Time Period  No visit limit    OT Start Time  1430    OT Stop Time  1515    OT Time Calculation (min)  45 min    Activity Tolerance  Patient tolerated treatment well    Behavior During Therapy  [facility] for tasks assessed/performed       Past Medical History:  Diagnosis Date  . Allergic rhinitis   . CTS (carpal tunnel syndrome)   . Hyperlipidemia   . Hypertension   . IFG (impaired fasting glucose)   . Obesity     Past Surgical History:  Procedure Laterality Date  . right rotator cuff    . VASECTOMY      There were no vitals filed for this visit.  Subjective Assessment - 11/12/17 1650    Subjective   S:  I was really sore after my last session.     Currently in Pain?  No/denies         [facility] OT Assessment - 11/12/17 0001      Assessment   Medical Diagnosis  s/p complex R RTC repair      Precautions   Precautions  Shoulder    Type of Shoulder Precautions  Sling x6 weeks. P/ROM only for 5 weeks (7/1-8/5); then progress to AA/ROM. See protocol for specifics.                OT Treatments/Exercises (OP) - 11/12/17 0001      Exercises   Exercises  Shoulder      Shoulder Exercises: Supine   Protraction  PROM;10 reps    Horizontal ABduction  PROM;10 reps    External Rotation  PROM;10 reps    External Rotation Limitations  45 degrees with shoulder adducted and 45 degrees with shoulder abducted to 45    Internal Rotation  PROM;10 reps    Flexion   PROM;10 reps    ABduction  PROM;10 reps    ABduction Limitations  90 degrees per protocol      Shoulder Exercises: Seated   Elevation  AROM;15 reps    Extension  AROM;15 reps    Row  AROM;15 reps      Shoulder Exercises: Therapy Ball   Flexion  15 reps    ABduction  15 reps      Shoulder Exercises: ROM/Strengthening   Thumb Tacks  low, elbows by side, 1 minute     Anterior Glide  3X10"    Caudal Glide  3X10"    Prot/Ret//Elev/Dep  low, elbows by side, 1 minute       Shoulder Exercises: Isometric Strengthening   Flexion  -- standing 3 X 10"    Extension  -- standing 3 X 10"    External Rotation  Other (comment) standing 3 X 10"    Internal Rotation  Other (comment) standing 3 X 10"    ABduction  Other (comment) standing 3 X 10"  ADduction  Other (comment) standing 3 X 10"      Manual Therapy   Manual Therapy  Myofascial release    Manual therapy comments  manual therapy completed seperately from all other interventions    Myofascial Release  myofascial release and manual stretching to right upper arm, scapular, and shoudler region to decrease pain and fascial restrctions and improve pain free mobility in right shoulder region required for use of right arm with functional activities.                OT Short Term Goals - 10/29/17 1543      OT SHORT TERM GOAL #1   Title  Pt will be provided with and educated on HEP to improve use of RUE as dominant during ADLs.     Time  6    Period  Weeks    Status  On-going      OT SHORT TERM GOAL #2   Title  Pt will improve RUE P/ROM to [facility] to improve ability to perform dressing tasks.     Time  6    Period  Weeks    Status  On-going      OT SHORT TERM GOAL #3   Title  Pt will improve RUE strength to 3-/5 to improve ability to reach items at waist to shoulder level.     Time  6    Period  Weeks    Status  On-going      OT SHORT TERM GOAL #4   Title  Pt will decrease pain in RUE to no greater than 4/10 to improve  ability to sleep in the bed.     Time  6    Period  Weeks    Status  On-going        OT Long Term Goals - 10/29/17 1544      OT LONG TERM GOAL #1   Title  Pt will return to highest level of functioning using RUE as dominant during B/IADLs.     Time  12    Period  Weeks    Status  On-going      OT LONG TERM GOAL #2   Title  Pt will decrease pain in RUE to 2/10 or less to improve ability to perform work tasks using RUE as dominant.     Time  12    Period  Weeks    Status  On-going      OT LONG TERM GOAL #3   Title  Pt will decrease fascial restrictions in RUE to minimal amounts or less to improve mobility required for functional reaching tasks.     Time  12    Period  Weeks    Status  On-going      OT LONG TERM GOAL #4   Title  Pt will increase RUE A/ROM to WNL to improve ability to perform overhead reaching tasks.     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     Time  12    Period  Weeks    Status  On-going            Plan - 11/12/17 1650    Clinical Impression Statement  A:  Patient able  to progress to next phase of protocol this date, phase 3.  increased abduction p/rom to 90 and external rotation to 45 degrees in neutral and at 45 abduction.  Patient tolerated increases well with minimal discomfort with abduction     Plan  P:  increase to 15" contractions for isometrics, add pulleys and supine aa/rom into flexion per protocol.         Patient will benefit from skilled therapeutic intervention in order to improve the following deficits and impairments:  Decreased activity tolerance, Decreased strength, Impaired flexibility, Decreased range of motion, Pain, Increased fascial restrictions, Impaired UE functional use  Visit Diagnosis: Other symptoms and signs involving the musculoskeletal system  Acute  pain of right shoulder    Problem List Patient Active Problem List   Diagnosis Date Noted  . Grief 04/16/2015  . Mild intermittent asthma 05/09/2014  . Allergic rhinitis 02/25/2013  . Hypertension   . IFG (impaired fasting glucose)   . Hyperlipidemia     Vangie Bicker, [place], OTR/L [phone]  11/12/2017, 5:06 PM  [place] [address] [place], [place], [address] Phone: [phone]   Fax:  [phone]  Name: Devon Mora MRN: [id number] Date of Birth: [birth year]

## 2017-11-14 ENCOUNTER — Encounter (HOSPITAL_COMMUNITY): Payer: Self-pay | Admitting: Occupational Therapy

## 2017-11-14 ENCOUNTER — Ambulatory Visit (HOSPITAL_COMMUNITY): Payer: BC Managed Care – PPO | Admitting: Occupational Therapy

## 2017-11-14 DIAGNOSIS — R29898 Other symptoms and signs involving the musculoskeletal system: Secondary | ICD-10-CM

## 2017-11-14 DIAGNOSIS — M25511 Pain in right shoulder: Secondary | ICD-10-CM

## 2017-11-14 NOTE — Therapy (Signed)
[place], [place], [address] Phone: [phone]   Fax:  [phone]  Occupational Therapy Treatment  Patient Details  Name: Devon Mora MRN: [id number] Date of Birth: [birth year] Referring Provider: Dr. Elsie Saas   Encounter Date: 11/14/2017  OT End of Session - 11/14/17 1349    Visit Number  7    Number of Visits  24    Date for OT Re-Evaluation  12/25/17 mini reassess on 8/3    Authorization Type  [employer]    Authorization Time Period  No visit limit    OT Start Time  1301    OT Stop Time  1343    OT Time Calculation (min)  42 min    Activity Tolerance  Patient tolerated treatment well    Behavior During Therapy  [facility] for tasks assessed/performed       Past Medical History:  Diagnosis Date  . Allergic rhinitis   . CTS (carpal tunnel syndrome)   . Hyperlipidemia   . Hypertension   . IFG (impaired fasting glucose)   . Obesity     Past Surgical History:  Procedure Laterality Date  . right rotator cuff    . VASECTOMY      There were no vitals filed for this visit.  Subjective Assessment - 11/14/17 1301    Subjective   S: I go back to the doctor on Monday.     Currently in Pain?  No/denies         [facility] OT Assessment - 11/14/17 1300      Assessment   Medical Diagnosis  s/p complex R RTC repair      Precautions   Precautions  Shoulder    Type of Shoulder Precautions  Sling x6 weeks. P/ROM only for 5 weeks (7/1-8/5); then progress to AA/ROM. See protocol for specifics.                OT Treatments/Exercises (OP) - 11/14/17 1304      Exercises   Exercises  Shoulder      Shoulder Exercises: Supine   Protraction  PROM;10 reps    Horizontal ABduction  PROM;10 reps    External Rotation  PROM;10 reps    External Rotation Limitations  45 degrees with shoulder adducted and 45 degrees with shoulder abducted to 45    Internal Rotation  PROM;10 reps    Flexion   PROM;10 reps    ABduction  PROM;10 reps    ABduction Limitations  90 degrees per protocol      Shoulder Exercises: Seated   Elevation  AROM;15 reps    Extension  AROM;15 reps    Row  AROM;15 reps    Other Seated Exercises  shoulder depression: 15X      Shoulder Exercises: Therapy Ball   Flexion  15 reps    ABduction  15 reps      Shoulder Exercises: ROM/Strengthening   Thumb Tacks  low, elbows by side, 1 minute     Anterior Glide  3X15"    Prot/Ret//Elev/Dep  low, elbows by side, 1 minute       Shoulder Exercises: Isometric Strengthening   Flexion  Other (comment) standing 3x15"    Extension  Other (comment) standing 3x15"    External Rotation  Other (comment) standing 3x15"    Internal Rotation  Other (comment) standing 3x15"    ABduction  Other (comment) standing 3x15"    ADduction  Other (comment)  standing 3x15"      Manual Therapy   Manual Therapy  Myofascial release    Manual therapy comments  manual therapy completed seperately from all other interventions    Myofascial Release  myofascial release and manual stretching to right upper arm, scapular, and shoudler region to decrease pain and fascial restrctions and improve pain free mobility in right shoulder region required for use of right arm with functional activities.                OT Short Term Goals - 10/29/17 1543      OT SHORT TERM GOAL #1   Title  Pt will be provided with and educated on HEP to improve use of RUE as dominant during ADLs.     Time  6    Period  Weeks    Status  On-going      OT SHORT TERM GOAL #2   Title  Pt will improve RUE P/ROM to [facility] to improve ability to perform dressing tasks.     Time  6    Period  Weeks    Status  On-going      OT SHORT TERM GOAL #3   Title  Pt will improve RUE strength to 3-/5 to improve ability to reach items at waist to shoulder level.     Time  6    Period  Weeks    Status  On-going      OT SHORT TERM GOAL #4   Title  Pt will decrease pain in RUE  to no greater than 4/10 to improve ability to sleep in the bed.     Time  6    Period  Weeks    Status  On-going        OT Long Term Goals - 10/29/17 1544      OT LONG TERM GOAL #1   Title  Pt will return to highest level of functioning using RUE as dominant during B/IADLs.     Time  12    Period  Weeks    Status  On-going      OT LONG TERM GOAL #2   Title  Pt will decrease pain in RUE to 2/10 or less to improve ability to perform work tasks using RUE as dominant.     Time  12    Period  Weeks    Status  On-going      OT LONG TERM GOAL #3   Title  Pt will decrease fascial restrictions in RUE to minimal amounts or less to improve mobility required for functional reaching tasks.     Time  12    Period  Weeks    Status  On-going      OT LONG TERM GOAL #4   Title  Pt will increase RUE A/ROM to WNL to improve ability to perform overhead reaching tasks.     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     Time  12    Period  Weeks    Status  On-going            Plan - 11/14/17 1350    Clinical Impression Statement  A: Continued with P/ROM, continuing with ROM limits for phase  3 of protocol. Did not progress to remaining exercises of phase 3 as MD as instructed P/ROM only for 5 weeks. Pt able to achieve ROM WNL for flexion today. Continued with isometrics and increased contraction time to 15" each, min fatigue at end of task. Verbal cuing for form, pt reports min/mod fatigue at end of session.     Plan  P: Continue with P/ROM only for 5 weeks per MD order, do not progress to AA/ROM until 8/5. Follow up on MD appt, increase isometric contractions to 20"    Consulted and Agree with Plan of Care  Patient       Patient will benefit from skilled therapeutic intervention in order to improve the following deficits and  impairments:  Decreased activity tolerance, Decreased strength, Impaired flexibility, Decreased range of motion, Pain, Increased fascial restrictions, Impaired UE functional use  Visit Diagnosis: Other symptoms and signs involving the musculoskeletal system  Acute pain of right shoulder    Problem List Patient Active Problem List   Diagnosis Date Noted  . Grief 04/16/2015  . Mild intermittent asthma 05/09/2014  . Allergic rhinitis 02/25/2013  . Hypertension   . IFG (impaired fasting glucose)   . Hyperlipidemia    Guadelupe Sabin, OTR/L  [phone] 11/14/2017, 1:53 PM  [place] [address] [place], [place], [address] Phone: [phone]   Fax:  [phone]  Name: Devon Mora MRN: [id number] Date of Birth: [birth year]

## 2017-11-19 ENCOUNTER — Ambulatory Visit (HOSPITAL_COMMUNITY): Payer: BC Managed Care – PPO | Admitting: Specialist

## 2017-11-19 ENCOUNTER — Encounter (HOSPITAL_COMMUNITY): Payer: Self-pay | Admitting: Specialist

## 2017-11-19 DIAGNOSIS — M25511 Pain in right shoulder: Secondary | ICD-10-CM

## 2017-11-19 DIAGNOSIS — R29898 Other symptoms and signs involving the musculoskeletal system: Secondary | ICD-10-CM

## 2017-11-19 NOTE — Therapy (Signed)
[place] [address] [place], [place], [address] Phone: [phone]   Fax:  [phone]  Occupational Therapy Treatment Progress Note Reporting Period 10/26/17 to 11/19/17  See note below for Objective Data and Assessment of Progress/Goals.      Patient Details  Name: Devon Mora MRN: [id number] Date of Birth: Sep 28, 1965 Referring Provider: Dr. Elsie Saas   Encounter Date: 11/19/2017  OT End of Session - 11/19/17 1331    Visit Number  8    Number of Visits  24    Date for OT Re-Evaluation  12/25/17    Authorization Type  [employer]PO    Authorization Time Period  No visit limit    OT Start Time  1305    OT Stop Time  1345    OT Time Calculation (min)  40 min    Activity Tolerance  Patient tolerated treatment well    Behavior During Therapy  [facility] for tasks assessed/performed       Past Medical History:  Diagnosis Date  . Allergic rhinitis   . CTS (carpal tunnel syndrome)   . Hyperlipidemia   . Hypertension   . IFG (impaired fasting glucose)   . Obesity     Past Surgical History:  Procedure Laterality Date  . right rotator cuff    . VASECTOMY      There were no vitals filed for this visit.  Subjective Assessment - 11/19/17 1330    Subjective   S:  I am anxious to start working it some more.     Currently in Pain?  No/denies    Pain Score  0-No pain         [facility] OT Assessment - 11/19/17 0001      Assessment   Medical Diagnosis  s/p complex R RTC repair      Precautions   Precautions  Shoulder    Type of Shoulder Precautions  Sling x6 weeks. P/ROM only for 5 weeks (7/1-8/5); then progress to AA/ROM. See protocol for specifics.       ADL   ADL comments  unable to use right arm, per protocol.        Observation/Other Assessments   Focus on Therapeutic Outcomes (FOTO)   43/100      Palpation   Palpation comment  min-mod fascial restrictions       AROM   Overall AROM   Unable to  assess;Due to precautions      PROM   Overall PROM Comments  assessed in supine, external and internal rotation with shoulder abducted to 45 degrees     Right Shoulder Flexion  180 Degrees 130    Right Shoulder ABduction  140 Degrees 102    Right Shoulder Internal Rotation  90 Degrees 90    Right Shoulder External Rotation  55 Degrees 0      Strength   Overall Strength  Unable to assess;Due to precautions               OT Treatments/Exercises (OP) - 11/19/17 0001      Exercises   Exercises  Shoulder      Shoulder Exercises: Supine   Protraction  PROM;10 reps    Horizontal ABduction  PROM;10 reps    External Rotation  PROM;10 reps    External Rotation Limitations  45 degrees with shoulder adducted and 45 degrees with shoulder abducted to 45    Internal Rotation  PROM;10 reps    Flexion  PROM;10 reps    ABduction  PROM;10 reps      Shoulder Exercises: Seated   Elevation  AROM;15 reps    Extension  AROM;15 reps    Row  AROM;15 reps    Other Seated Exercises  shoulder depression: 15X      Shoulder Exercises: Therapy Ball   Flexion  20 reps    ABduction  20 reps      Shoulder Exercises: ROM/Strengthening   Thumb Tacks  low, elbows by side, 1 minute     Anterior Glide  3X15"    Caudal Glide  3 X 15"    Prot/Ret//Elev/Dep  low, elbows by side, 1 minute       Shoulder Exercises: Isometric Strengthening   Flexion  -- standing 3 X 30"    Extension  Other (comment) standing 3 X 30"    External Rotation  Other (comment) standing 3 X 30"    Internal Rotation  Other (comment) standing 3 X 30"    ABduction  Other (comment) standing 3 X 30"    ADduction  Other (comment) standing 3 X 30"      Manual Therapy   Manual Therapy  Myofascial release    Manual therapy comments  manual therapy completed seperately from all other interventions    Myofascial Release  myofascial release and manual stretching to right upper arm, scapular, and shoudler region to decrease pain and  fascial restrctions and improve pain free mobility in right shoulder region required for use of right arm with functional activities.                OT Short Term Goals - 11/12/17 1333      OT SHORT TERM GOAL #1   Title  Pt will be provided with and educated on HEP to improve use of RUE as dominant during ADLs.     Time  6    Period  Weeks    Status  On-going      OT SHORT TERM GOAL #2   Title  Pt will improve RUE P/ROM to [facility] to improve ability to perform dressing tasks.     Time  6    Period  Weeks    Status  On-going      OT SHORT TERM GOAL #3   Title  Pt will improve RUE strength to 3-/5 to improve ability to reach items at waist to shoulder level.     Time  6    Period  Weeks    Status  On-going      OT SHORT TERM GOAL #4   Title  Pt will decrease pain in RUE to no greater than 4/10 to improve ability to sleep in the bed.     Time  6    Period  Weeks    Status  Achieved        OT Long Term Goals - 10/29/17 1544      OT LONG TERM GOAL #1   Title  Pt will return to highest level of functioning using RUE as dominant during B/IADLs.     Time  12    Period  Weeks    Status  On-going      OT LONG TERM GOAL #2   Title  Pt will decrease pain in RUE to 2/10 or less to improve ability to perform work tasks using RUE as dominant.     Time  12    Period  Weeks  Status  On-going      OT LONG TERM GOAL #3   Title  Pt will decrease fascial restrictions in RUE to minimal amounts or less to improve mobility required for functional reaching tasks.     Time  12    Period  Weeks    Status  On-going      OT LONG TERM GOAL #4   Title  Pt will increase RUE A/ROM to WNL to improve ability to perform overhead reaching tasks.     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     Time  12    Period  Weeks     Status  On-going            Plan - 11/19/17 1331    Clinical Impression Statement  A:  Per protocol, continued P/ROM with ROM limits for phase 3 protocol.  Patient has made significant improvements in P/ROM in supine.  Increased contraction time for isometrics to 30" for 3 sets.  Patient continues to have pain with p/rom at end range.      OT Frequency  2x / week    OT Duration  4 weeks    OT Treatment/Interventions  Self-care/ADL training;Therapeutic exercise;Ultrasound;Manual Therapy;Therapeutic activities;Cryotherapy;Electrical Stimulation;Moist Heat;Passive range of motion;Patient/family education    Plan  P:  Follow up on MD visit, begin AA/ROM on 8/5.  focus on achieving full p/rom by 11/26/17,       Patient will benefit from skilled therapeutic intervention in order to improve the following deficits and impairments:  Decreased activity tolerance, Decreased strength, Impaired flexibility, Decreased range of motion, Pain, Increased fascial restrictions, Impaired UE functional use  Visit Diagnosis: Other symptoms and signs involving the musculoskeletal system  Acute pain of right shoulder    Problem List Patient Active Problem List   Diagnosis Date Noted  . Grief 04/16/2015  . Mild intermittent asthma 05/09/2014  . Allergic rhinitis 02/25/2013  . Hypertension   . IFG (impaired fasting glucose)   . Hyperlipidemia     Vangie Bicker, [place], OTR/L [phone]  11/19/2017, 1:40 PM  [place] [address] [place], [place], [address] Phone: [phone]   Fax:  [phone]  Name: EIVIN MASCIO MRN: [id number] Date of Birth: 17-Apr-1966

## 2017-11-21 ENCOUNTER — Encounter (HOSPITAL_COMMUNITY): Payer: Self-pay | Admitting: Occupational Therapy

## 2017-11-21 ENCOUNTER — Ambulatory Visit (HOSPITAL_COMMUNITY): Payer: BC Managed Care – PPO | Admitting: Occupational Therapy

## 2017-11-21 DIAGNOSIS — M25511 Pain in right shoulder: Secondary | ICD-10-CM

## 2017-11-21 DIAGNOSIS — R29898 Other symptoms and signs involving the musculoskeletal system: Secondary | ICD-10-CM

## 2017-11-21 NOTE — Therapy (Signed)
[place], [place], [address] Phone: [phone]   Fax:  [phone]  Occupational Therapy Treatment  Patient Details  Name: Devon Mora MRN: [id number] Date of Birth: 03-02-1966 Referring Provider: Dr. Elsie Saas   Encounter Date: 11/21/2017  OT End of Session - 11/21/17 1345    Visit Number  9    Number of Visits  24    Date for OT Re-Evaluation  12/25/17    Authorization Type  [employer] P[employer]    Authorization Time Period  No visit limit    OT Start Time  1303    OT Stop Time  1346    OT Time Calculation (min)  43 min    Activity Tolerance  Patient tolerated treatment well    Behavior During Therapy  [facility] for tasks assessed/performed       Past Medical History:  Diagnosis Date  . Allergic rhinitis   . CTS (carpal tunnel syndrome)   . Hyperlipidemia   . Hypertension   . IFG (impaired fasting glucose)   . Obesity     Past Surgical History:  Procedure Laterality Date  . right rotator cuff    . VASECTOMY      There were no vitals filed for this visit.  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     Currently in Pain?  Yes    Pain Score  3     Pain Location  Shoulder    Pain Orientation  Right    Pain Descriptors / Indicators  Aching;Sore    Pain Type  Acute pain    Pain Radiating Towards  n/a    Pain Onset  1 to 4 weeks ago    Pain Frequency  Intermittent    Aggravating Factors   movement    Pain Relieving Factors  rest, pain medication    Effect of Pain on Daily Activities  unable to use RUE for ADLs    Multiple Pain Sites  No         [facility] OT Assessment - 11/21/17 1259      Assessment   Medical Diagnosis  s/p complex R RTC repair      Precautions   Precautions  Shoulder    Type of Shoulder Precautions  Sling x6 weeks. P/ROM only for 5 weeks (7/1-8/5); then progress to AA/ROM. See protocol for specifics.                 OT Treatments/Exercises (OP) - 11/21/17 1306      Exercises   Exercises  Shoulder      Shoulder Exercises: Supine   Protraction  PROM;10 reps    Horizontal ABduction  PROM;10 reps    External Rotation  PROM;10 reps    Internal Rotation  PROM;10 reps    Flexion  PROM;10 reps    ABduction  PROM;10 reps      Shoulder Exercises: Seated   Elevation  AROM;20 reps    Extension  AROM;20 reps    Row  AROM;20 reps    Other Seated Exercises  shoulder depression: 20X      Shoulder Exercises: Isometric Strengthening   Flexion  Other (comment) standing 3x30"    Extension  Other (comment) standing 3x30"    External Rotation  Other (comment) standing 3x30"    Internal Rotation  Other (comment) standing 3x30"  ABduction  Other (comment) standing 3x30"    ADduction  Other (comment) standing 3x30"      Manual Therapy   Manual Therapy  Myofascial release    Manual therapy comments  manual therapy completed seperately from all other interventions    Myofascial Release  myofascial release and manual stretching to right upper arm, scapular, and shoudler region to decrease pain and fascial restrctions and improve pain free mobility in right shoulder region required for use of right arm with functional activities.                OT Short Term Goals - 11/12/17 1333      OT SHORT TERM GOAL #1   Title  Pt will be provided with and educated on HEP to improve use of RUE as dominant during ADLs.     Time  6    Period  Weeks    Status  On-going      OT SHORT TERM GOAL #2   Title  Pt will improve RUE P/ROM to [facility] to improve ability to perform dressing tasks.     Time  6    Period  Weeks    Status  On-going      OT SHORT TERM GOAL #3   Title  Pt will improve RUE strength to 3-/5 to improve ability to reach items at waist to shoulder level.     Time  6    Period  Weeks    Status  On-going      OT SHORT TERM GOAL #4   Title  Pt will decrease pain in RUE to no  greater than 4/10 to improve ability to sleep in the bed.     Time  6    Period  Weeks    Status  Achieved        OT Long Term Goals - 10/29/17 1544      OT LONG TERM GOAL #1   Title  Pt will return to highest level of functioning using RUE as dominant during B/IADLs.     Time  12    Period  Weeks    Status  On-going      OT LONG TERM GOAL #2   Title  Pt will decrease pain in RUE to 2/10 or less to improve ability to perform work tasks using RUE as dominant.     Time  12    Period  Weeks    Status  On-going      OT LONG TERM GOAL #3   Title  Pt will decrease fascial restrictions in RUE to minimal amounts or less to improve mobility required for functional reaching tasks.     Time  12    Period  Weeks    Status  On-going      OT LONG TERM GOAL #4   Title  Pt will increase RUE A/ROM to WNL to improve ability to perform overhead reaching tasks.     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     Time  12    Period  Weeks    Status  On-going            Plan - 11/21/17 1335    Clinical  Impression Statement  A: Continued with P/ROM progressing to ROM as tolerated, pt able to tolerate ROM WNL for all motions. Continued with isometrics for 30" and with scapular A/ROM progressing to 20 repetitions. Verbal cuing for form and technique.  Pt reports MD is pleased with his progress and he will wear sling for 2 additonal weeks, can progress to AA/ROM next week (8/5).     Plan  P: Progress to AA/ROM, continue with manual therapy and passive stretching. Update HEP for AA/ROM.        Patient will benefit from skilled therapeutic intervention in order to improve the following deficits and impairments:  Decreased activity tolerance, Decreased strength, Impaired flexibility, Decreased range of motion, Pain, Increased fascial  restrictions, Impaired UE functional use  Visit Diagnosis: Other symptoms and signs involving the musculoskeletal system  Acute pain of right shoulder    Problem List Patient Active Problem List   Diagnosis Date Noted  . Grief 04/16/2015  . Mild intermittent asthma 05/09/2014  . Allergic rhinitis 02/25/2013  . Hypertension   . IFG (impaired fasting glucose)   . Hyperlipidemia    Guadelupe Sabin, OTR/L  [phone] 11/21/2017, 1:46 PM  [facility] [address] [place], [place], [address] Phone: [phone]   Fax:  [phone]  Name: Devon Mora MRN: [id number] Date of Birth: May 02, 1965

## 2017-11-26 ENCOUNTER — Encounter (HOSPITAL_COMMUNITY): Payer: Self-pay

## 2017-11-26 ENCOUNTER — Ambulatory Visit (HOSPITAL_COMMUNITY): Payer: BC Managed Care – PPO | Attending: Orthopedic Surgery

## 2017-11-26 DIAGNOSIS — R29898 Other symptoms and signs involving the musculoskeletal system: Secondary | ICD-10-CM | POA: Insufficient documentation

## 2017-11-26 DIAGNOSIS — M25511 Pain in right shoulder: Secondary | ICD-10-CM | POA: Diagnosis present

## 2017-11-26 NOTE — Therapy (Signed)
[place], [place], [address] Phone: [phone]   Fax:  [phone]  Occupational Therapy Treatment  Patient Details  Name: Devon Mora MRN: [id number] Date of Birth: [birth year] Referring Provider: Dr. Elsie Saas   Encounter Date: 11/26/2017  OT End of Session - 11/26/17 1318    Visit Number  9    Number of Visits  24    Date for OT Re-Evaluation  12/25/17    Authorization Type  [employer]    Authorization Time Period  No visit limit    OT Start Time  1305    OT Stop Time  1340    OT Time Calculation (min)  35 min    Activity Tolerance  Patient tolerated treatment well    Behavior During Therapy  [facility] for tasks assessed/performed       Past Medical History:  Diagnosis Date  . Allergic rhinitis   . CTS (carpal tunnel syndrome)   . Hyperlipidemia   . Hypertension   . IFG (impaired fasting glucose)   . Obesity     Past Surgical History:  Procedure Laterality Date  . right rotator cuff    . VASECTOMY      There were no vitals filed for this visit.  Subjective Assessment - 11/26/17 1315    Subjective   S: I don't feel any pain. It's just a heaviness.     Currently in Pain?  No/denies         [facility] OT Assessment - 11/26/17 1316      Assessment   Medical Diagnosis  s/p complex R RTC repair      Precautions   Precautions  Shoulder    Type of Shoulder Precautions  Sling x6 weeks. P/ROM only for 5 weeks (7/1-8/5); then progress to AA/ROM. See protocol for specifics.                OT Treatments/Exercises (OP) - 11/26/17 1316      Exercises   Exercises  Shoulder      Shoulder Exercises: Supine   Protraction  PROM;5 reps;AAROM;10 reps    Horizontal ABduction  PROM;5 reps;AAROM;10 reps    External Rotation  PROM;5 reps;AAROM;10 reps    Internal Rotation  PROM;5 reps;AAROM;10 reps    Flexion  PROM;5 reps;AAROM;10 reps    ABduction  PROM;5 reps;AAROM;10 reps       Shoulder Exercises: Pulleys   Flexion  1 minute    ABduction  1 minute      Manual Therapy   Manual Therapy  Myofascial release    Manual therapy comments  manual therapy completed seperately from all other interventions    Myofascial Release  myofascial release and manual stretching to right upper arm, scapular, and shoudler region to decrease pain and fascial restrctions and improve pain free mobility in right shoulder region required for use of right arm with functional activities.              OT Education - 11/26/17 1318    Education Details  AA/ROM shoulder exercises. Pt is able to stop previous exercises that were given.     Person(s) Educated  Patient    Methods  Explanation;Demonstration;Verbal cues;Handout    Comprehension  Verbalized understanding;Returned demonstration       OT Short Term Goals - 11/26/17 1319      OT SHORT TERM GOAL #1   Title  Pt will be provided with and educated on  HEP to improve use of RUE as dominant during ADLs.     Time  6    Period  Weeks    Status  On-going      OT SHORT TERM GOAL #2   Title  Pt will improve RUE P/ROM to [facility] to improve ability to perform dressing tasks.     Time  6    Period  Weeks    Status  On-going      OT SHORT TERM GOAL #3   Title  Pt will improve RUE strength to 3-/5 to improve ability to reach items at waist to shoulder level.     Time  6    Period  Weeks    Status  On-going      OT SHORT TERM GOAL #4   Title  Pt will decrease pain in RUE to no greater than 4/10 to improve ability to sleep in the bed.     Time  6    Period  Weeks        OT Long Term Goals - 10/29/17 1544      OT LONG TERM GOAL #1   Title  Pt will return to highest level of functioning using RUE as dominant during B/IADLs.     Time  12    Period  Weeks    Status  On-going      OT LONG TERM GOAL #2   Title  Pt will decrease pain in RUE to 2/10 or less to improve ability to perform work tasks using RUE as dominant.     Time  12     Period  Weeks    Status  On-going      OT LONG TERM GOAL #3   Title  Pt will decrease fascial restrictions in RUE to minimal amounts or less to improve mobility required for functional reaching tasks.     Time  12    Period  Weeks    Status  On-going      OT LONG TERM GOAL #4   Title  Pt will increase RUE A/ROM to WNL to improve ability to perform overhead reaching tasks.     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     Time  12    Period  Weeks    Status  On-going            Plan - 11/26/17 1341    Clinical Impression Statement  A: Pt with minimal fascial restrictions noted on upper trapezius region. Manual therapy completed to address with good results. Progressed to AA/ROM shoulder exercises supine and standing. HEP was updated. VC for form and technique were needed.     Plan  P: Follow up on HEP. Continue with manual therapy to address fascial restrictions as needed. Add PVC pipe slide.     Consulted and Agree with Plan of Care  Patient       Patient will benefit from skilled therapeutic intervention in order to improve the following deficits and impairments:  Decreased activity tolerance, Decreased strength, Impaired flexibility, Decreased range of motion, Pain, Increased fascial restrictions, Impaired UE functional use  Visit Diagnosis: Other symptoms and signs involving the musculoskeletal system  Acute pain of right shoulder  Problem List Patient Active Problem List   Diagnosis Date Noted  . Grief 04/16/2015  . Mild intermittent asthma 05/09/2014  . Allergic rhinitis 02/25/2013  . Hypertension   . IFG (impaired fasting glucose)   . Hyperlipidemia    Ailene Ravel, OTR/L,CBIS  [phone]  11/26/2017, 1:43 PM  [place], [place], [address] Phone: [phone]   Fax:  [phone]  Name: JEORGE REISTER MRN: [id number] Date of Birth: 26-Jan-1966

## 2017-11-26 NOTE — Patient Instructions (Signed)

## 2017-11-28 ENCOUNTER — Ambulatory Visit (HOSPITAL_COMMUNITY): Payer: BC Managed Care – PPO | Admitting: Occupational Therapy

## 2017-11-28 ENCOUNTER — Encounter (HOSPITAL_COMMUNITY): Payer: Self-pay | Admitting: Occupational Therapy

## 2017-11-28 DIAGNOSIS — M25511 Pain in right shoulder: Secondary | ICD-10-CM

## 2017-11-28 DIAGNOSIS — R29898 Other symptoms and signs involving the musculoskeletal system: Secondary | ICD-10-CM

## 2017-11-28 NOTE — Therapy (Signed)
[place], [place], [address] Phone: [phone]   Fax:  [phone]  Occupational Therapy Treatment  Patient Details  Name: Devon Mora MRN: [id number] Date of Birth: 1966-04-15 Referring Provider: Dr. Elsie Saas   Encounter Date: 11/28/2017  OT End of Session - 11/28/17 1346    Visit Number  10    Number of Visits  24    Date for OT Re-Evaluation  12/25/17    Authorization Type  [place] PPO    Authorization Time Period  No visit limit    OT Start Time  1300    OT Stop Time  1344    OT Time Calculation (min)  44 min    Activity Tolerance  Patient tolerated treatment well    Behavior During Therapy  [facility] for tasks assessed/performed       Past Medical History:  Diagnosis Date  . Allergic rhinitis   . CTS (carpal tunnel syndrome)   . Hyperlipidemia   . Hypertension   . IFG (impaired fasting glucose)   . Obesity     Past Surgical History:  Procedure Laterality Date  . right rotator cuff    . VASECTOMY      There were no vitals filed for this visit.  Subjective Assessment - 11/28/17 1256    Subjective   S: I've been sore since Monday but I did my exercises.     Currently in Pain?  Yes    Pain Score  4     Pain Location  Shoulder    Pain Orientation  Right    Pain Descriptors / Indicators  Aching;Sore    Pain Type  Acute pain    Pain Radiating Towards  N/A    Pain Onset  More than a month ago    Pain Frequency  Intermittent    Aggravating Factors   movement    Pain Relieving Factors  rest, pain medication    Effect of Pain on Daily Activities  unable to use RUE for ADLs    Multiple Pain Sites  No         [facility] OT Assessment - 11/28/17 1256      Assessment   Medical Diagnosis  s/p complex R RTC repair      Precautions   Precautions  Shoulder    Type of Shoulder Precautions  Sling x6 weeks. P/ROM only for 5 weeks (7/1-8/5); then progress to AA/ROM. See protocol for  specifics.                OT Treatments/Exercises (OP) - 11/28/17 1257      Exercises   Exercises  Shoulder      Shoulder Exercises: Supine   Protraction  PROM;5 reps;AAROM;10 reps    Horizontal ABduction  PROM;5 reps;AAROM;10 reps    External Rotation  PROM;5 reps;AAROM;10 reps    Internal Rotation  PROM;5 reps;AAROM;10 reps    Flexion  PROM;5 reps;AAROM;10 reps    ABduction  PROM;5 reps;AAROM;10 reps      Shoulder Exercises: Standing   Protraction  AAROM;10 reps    Horizontal ABduction  AAROM;10 reps    External Rotation  AAROM;10 reps    Internal Rotation  AAROM;10 reps    Flexion  AAROM;10 reps    ABduction  AAROM;10 reps    Other Standing Exercises  PVC pipe slide: 10X flexion      Shoulder Exercises: Pulleys   Flexion  1 minute  ABduction  1 minute      Shoulder Exercises: ROM/Strengthening   Wall Wash  1'    Thumb Tacks  1'      Manual Therapy   Manual Therapy  Myofascial release    Manual therapy comments  manual therapy completed seperately from all other interventions    Myofascial Release  myofascial release and manual stretching to right upper arm, scapular, and shoudler region to decrease pain and fascial restrctions and improve pain free mobility in right shoulder region required for use of right arm with functional activities.                OT Short Term Goals - 11/26/17 1319      OT SHORT TERM GOAL #1   Title  Pt will be provided with and educated on HEP to improve use of RUE as dominant during ADLs.     Time  6    Period  Weeks    Status  On-going      OT SHORT TERM GOAL #2   Title  Pt will improve RUE P/ROM to [facility] to improve ability to perform dressing tasks.     Time  6    Period  Weeks    Status  On-going      OT SHORT TERM GOAL #3   Title  Pt will improve RUE strength to 3-/5 to improve ability to reach items at waist to shoulder level.     Time  6    Period  Weeks    Status  On-going      OT SHORT TERM GOAL #4    Title  Pt will decrease pain in RUE to no greater than 4/10 to improve ability to sleep in the bed.     Time  6    Period  Weeks        OT Long Term Goals - 10/29/17 1544      OT LONG TERM GOAL #1   Title  Pt will return to highest level of functioning using RUE as dominant during B/IADLs.     Time  12    Period  Weeks    Status  On-going      OT LONG TERM GOAL #2   Title  Pt will decrease pain in RUE to 2/10 or less to improve ability to perform work tasks using RUE as dominant.     Time  12    Period  Weeks    Status  On-going      OT LONG TERM GOAL #3   Title  Pt will decrease fascial restrictions in RUE to minimal amounts or less to improve mobility required for functional reaching tasks.     Time  12    Period  Weeks    Status  On-going      OT LONG TERM GOAL #4   Title  Pt will increase RUE A/ROM to WNL to improve ability to perform overhead reaching tasks.     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     Time  12    Period  Weeks    Status  On-going            Plan - 11/28/17 1346  Clinical Impression Statement  A: Continued with manual therapy to address fascial restrictions in anterior deltoid and trapezius regions. Pt reporting soreness since beginning new exercises this week. Continued with AA/ROM today, adding shoulder height thumb tacks, wall wash, and pvc pipe slide. Pt experiencing the most discomfort with abduction during AA/ROM. Verbal cuing for form and technique.     Plan  P: Continue with manual therapy, add scapular theraband and serratus anterior punches       Patient will benefit from skilled therapeutic intervention in order to improve the following deficits and impairments:  Decreased activity tolerance, Decreased strength, Impaired flexibility, Decreased range of motion, Pain,  Increased fascial restrictions, Impaired UE functional use  Visit Diagnosis: Other symptoms and signs involving the musculoskeletal system  Acute pain of right shoulder    Problem List Patient Active Problem List   Diagnosis Date Noted  . Grief 04/16/2015  . Mild intermittent asthma 05/09/2014  . Allergic rhinitis 02/25/2013  . Hypertension   . IFG (impaired fasting glucose)   . Hyperlipidemia    Guadelupe Sabin, OTR/L  [phone] 11/28/2017, 1:48 PM  [place] [address] [place], [place], [address] Phone: [phone]   Fax:  [phone]  Name: Devon Mora MRN: [id number] Date of Birth: 04-09-1966

## 2017-12-03 ENCOUNTER — Encounter (HOSPITAL_COMMUNITY): Payer: Self-pay | Admitting: Occupational Therapy

## 2017-12-03 ENCOUNTER — Ambulatory Visit (HOSPITAL_COMMUNITY): Payer: BC Managed Care – PPO | Admitting: Occupational Therapy

## 2017-12-03 DIAGNOSIS — M25511 Pain in right shoulder: Secondary | ICD-10-CM

## 2017-12-03 DIAGNOSIS — R29898 Other symptoms and signs involving the musculoskeletal system: Secondary | ICD-10-CM | POA: Diagnosis not present

## 2017-12-03 NOTE — Therapy (Signed)
[place], [place], [address] Phone: [phone]   Fax:  [phone]  Occupational Therapy Treatment  Patient Details  Name: Devon Mora MRN: [id number] Date of Birth: 1965-05-06 Referring Provider: Dr. Elsie Saas   Encounter Date: 12/03/2017  OT End of Session - 12/03/17 1340    Visit Number  11    Number of Visits  24    Date for OT Re-Evaluation  12/25/17    Authorization Type  [place] PPO    Authorization Time Period  No visit limit    OT Start Time  1258    OT Stop Time  1339    OT Time Calculation (min)  41 min    Activity Tolerance  Patient tolerated treatment well    Behavior During Therapy  [facility] for tasks assessed/performed       Past Medical History:  Diagnosis Date  . Allergic rhinitis   . CTS (carpal tunnel syndrome)   . Hyperlipidemia   . Hypertension   . IFG (impaired fasting glucose)   . Obesity     Past Surgical History:  Procedure Laterality Date  . right rotator cuff    . VASECTOMY      There were no vitals filed for this visit.  Subjective Assessment - 12/03/17 1253    Subjective   S: I've been doing my exercises and everything is going well.     Currently in Pain?  Yes    Pain Score  2     Pain Location  Shoulder    Pain Orientation  Right    Pain Descriptors / Indicators  Aching;Sore    Pain Type  Acute pain    Pain Radiating Towards  N/A    Pain Onset  More than a month ago    Pain Frequency  Intermittent    Aggravating Factors   movement    Pain Relieving Factors  rest, pain medication    Effect of Pain on Daily Activities  unable to use RUE for ADLs    Multiple Pain Sites  No         [facility] OT Assessment - 12/03/17 1253      Assessment   Medical Diagnosis  s/p complex R RTC repair      Precautions   Precautions  Shoulder    Type of Shoulder Precautions  Sling x6 weeks. P/ROM only for 5 weeks (7/1-8/5); then progress to AA/ROM. See protocol for  specifics.                OT Treatments/Exercises (OP) - 12/03/17 1253      Exercises   Exercises  Shoulder      Shoulder Exercises: Supine   Protraction  PROM;5 reps;AAROM;12 reps    Horizontal ABduction  PROM;5 reps;AAROM;12 reps    External Rotation  PROM;5 reps;AAROM;12 reps    Internal Rotation  PROM;5 reps;AAROM;12 reps    Flexion  PROM;5 reps;AAROM;12 reps    ABduction  PROM;5 reps;AAROM;12 reps    Other Supine Exercises  serratus anterior punches, 10X      Shoulder Exercises: Standing   Protraction  AAROM;10 reps    Horizontal ABduction  AAROM;10 reps    External Rotation  AAROM;10 reps    Internal Rotation  AAROM;10 reps    Flexion  AAROM;10 reps    ABduction  AAROM;10 reps    Extension  Theraband;10 reps    Theraband Level (Shoulder Extension)  Level 2 (  Red)    Row  Theraband;10 reps    Theraband Level (Shoulder Row)  Level 2 (Red)    Other Standing Exercises  PVC pipe slide: 10X flexion      Shoulder Exercises: Pulleys   Flexion  1 minute    ABduction  1 minute      Shoulder Exercises: ROM/Strengthening   Wall Wash  1'      Manual Therapy   Manual Therapy  Myofascial release    Manual therapy comments  manual therapy completed seperately from all other interventions    Myofascial Release  myofascial release and manual stretching to right upper arm, scapular, and shoudler region to decrease pain and fascial restrctions and improve pain free mobility in right shoulder region required for use of right arm with functional activities.                OT Short Term Goals - 11/26/17 1319      OT SHORT TERM GOAL #1   Title  Pt will be provided with and educated on HEP to improve use of RUE as dominant during ADLs.     Time  6    Period  Weeks    Status  On-going      OT SHORT TERM GOAL #2   Title  Pt will improve RUE P/ROM to [facility] to improve ability to perform dressing tasks.     Time  6    Period  Weeks    Status  On-going      OT SHORT  TERM GOAL #3   Title  Pt will improve RUE strength to 3-/5 to improve ability to reach items at waist to shoulder level.     Time  6    Period  Weeks    Status  On-going      OT SHORT TERM GOAL #4   Title  Pt will decrease pain in RUE to no greater than 4/10 to improve ability to sleep in the bed.     Time  6    Period  Weeks        OT Long Term Goals - 10/29/17 1544      OT LONG TERM GOAL #1   Title  Pt will return to highest level of functioning using RUE as dominant during B/IADLs.     Time  12    Period  Weeks    Status  On-going      OT LONG TERM GOAL #2   Title  Pt will decrease pain in RUE to 2/10 or less to improve ability to perform work tasks using RUE as dominant.     Time  12    Period  Weeks    Status  On-going      OT LONG TERM GOAL #3   Title  Pt will decrease fascial restrictions in RUE to minimal amounts or less to improve mobility required for functional reaching tasks.     Time  12    Period  Weeks    Status  On-going      OT LONG TERM GOAL #4   Title  Pt will increase RUE A/ROM to WNL to improve ability to perform overhead reaching tasks.     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     Time  12    Period  Weeks    Status  On-going            Plan - 12/03/17 1317    Clinical Impression Statement  A: Pt reporting success with exercises over the weekend, does have fluid filled lump on olecranon process seemingly unrelated to shoulder. Continued with manual therapy to anterior deltoid to reduce fascial restrictions. Increased AA/ROM repetitions to 12 in supine today, added serratus anterior punches.  Added scapular theraband extension and row with red theraband, good form after initial cuing. Verbal cuing during session for form and technique, mod fatigue at end of session.     Plan  P:  Continue with AA/ROM increasing standing reps to 12, add proximal shoulder strengthening in supine       Patient will benefit from skilled therapeutic intervention in order to improve the following deficits and impairments:  Decreased activity tolerance, Decreased strength, Impaired flexibility, Decreased range of motion, Pain, Increased fascial restrictions, Impaired UE functional use  Visit Diagnosis: Other symptoms and signs involving the musculoskeletal system  Acute pain of right shoulder    Problem List Patient Active Problem List   Diagnosis Date Noted  . Grief 04/16/2015  . Mild intermittent asthma 05/09/2014  . Allergic rhinitis 02/25/2013  . Hypertension   . IFG (impaired fasting glucose)   . Hyperlipidemia     Guadelupe Sabin, OTR/L  [phone] 12/03/2017, 1:43 PM  [place] [address] [place], [place], [address] Phone: [phone]   Fax:  [phone]  Name: BELAL SCALLON MRN: [id number] Date of Birth: [birth year]

## 2017-12-05 ENCOUNTER — Encounter (HOSPITAL_COMMUNITY): Payer: Self-pay | Admitting: Occupational Therapy

## 2017-12-05 ENCOUNTER — Ambulatory Visit (HOSPITAL_COMMUNITY): Payer: BC Managed Care – PPO | Admitting: Occupational Therapy

## 2017-12-05 DIAGNOSIS — M25511 Pain in right shoulder: Secondary | ICD-10-CM

## 2017-12-05 DIAGNOSIS — R29898 Other symptoms and signs involving the musculoskeletal system: Secondary | ICD-10-CM | POA: Diagnosis not present

## 2017-12-05 NOTE — Therapy (Signed)
[place], [place], [address] Phone: [phone]   Fax:  [phone]  Occupational Therapy Treatment  Patient Details  Name: Devon Mora MRN: [id number] Date of Birth: 1965-12-26 Referring Provider: Dr. Elsie Saas   Encounter Date: 12/05/2017  OT End of Session - 12/05/17 1342    Visit Number  12    Number of Visits  24    Date for OT Re-Evaluation  12/25/17    Authorization Type  [employer]    Authorization Time Period  No visit limit    OT Start Time  1300    OT Stop Time  1339    OT Time Calculation (min)  39 min    Activity Tolerance  Patient tolerated treatment well    Behavior During Therapy  [facility] for tasks assessed/performed       Past Medical History:  Diagnosis Date  . Allergic rhinitis   . CTS (carpal tunnel syndrome)   . Hyperlipidemia   . Hypertension   . IFG (impaired fasting glucose)   . Obesity     Past Surgical History:  Procedure Laterality Date  . right rotator cuff    . VASECTOMY      There were no vitals filed for this visit.  Subjective Assessment - 12/05/17 1259    Subjective   S: I got my arm above my head in the shower this morning.     Currently in Pain?  Yes    Pain Score  2     Pain Location  Shoulder    Pain Orientation  Right    Pain Descriptors / Indicators  Aching;Sore    Pain Type  Acute pain    Pain Radiating Towards  N/A    Pain Onset  More than a month ago    Pain Frequency  Intermittent    Aggravating Factors   movement    Pain Relieving Factors  rest, pain medication    Effect of Pain on Daily Activities  max effect on ADLs    Multiple Pain Sites  No         [facility] OT Assessment - 12/05/17 1257      Assessment   Medical Diagnosis  s/p complex R RTC repair      Precautions   Precautions  Shoulder    Type of Shoulder Precautions  Sling x6 weeks. P/ROM only for 5 weeks (7/1-8/5); then progress to AA/ROM. See protocol for specifics.                 OT Treatments/Exercises (OP) - 12/05/17 1303      Exercises   Exercises  Shoulder      Shoulder Exercises: Supine   Protraction  PROM;5 reps;AAROM;12 reps    Horizontal ABduction  PROM;5 reps;AAROM;12 reps    External Rotation  PROM;5 reps;AROM;12 reps    Internal Rotation  PROM;5 reps;AROM;12 reps    Flexion  PROM;5 reps;AAROM;12 reps    ABduction  PROM;5 reps;AAROM;12 reps    Other Supine Exercises  serratus anterior punches, 12X      Shoulder Exercises: Standing   Protraction  AAROM;12 reps    Horizontal ABduction  AAROM;12 reps    External Rotation  AAROM;12 reps    Internal Rotation  AAROM;12 reps    Flexion  AAROM;12 reps    ABduction  AAROM;12 reps    Extension  Theraband;12 reps    Theraband Level (Shoulder Extension)  Level 2 (  Red)    Row  Theraband;12 reps    Theraband Level (Shoulder Row)  Level 2 (Red)      Shoulder Exercises: ROM/Strengthening   Wall Wash  1'    Proximal Shoulder Strengthening, Supine  10X each no rest breaks      Manual Therapy   Manual Therapy  Myofascial release    Manual therapy comments  manual therapy completed seperately from all other interventions    Myofascial Release  myofascial release and manual stretching to right upper arm, scapular, and shoudler region to decrease pain and fascial restrctions and improve pain free mobility in right shoulder region required for use of right arm with functional activities.                OT Short Term Goals - 11/26/17 1319      OT SHORT TERM GOAL #1   Title  Pt will be provided with and educated on HEP to improve use of RUE as dominant during ADLs.     Time  6    Period  Weeks    Status  On-going      OT SHORT TERM GOAL #2   Title  Pt will improve RUE P/ROM to [facility] to improve ability to perform dressing tasks.     Time  6    Period  Weeks    Status  On-going      OT SHORT TERM GOAL #3   Title  Pt will improve RUE strength to 3-/5 to improve ability to  reach items at waist to shoulder level.     Time  6    Period  Weeks    Status  On-going      OT SHORT TERM GOAL #4   Title  Pt will decrease pain in RUE to no greater than 4/10 to improve ability to sleep in the bed.     Time  6    Period  Weeks        OT Long Term Goals - 10/29/17 1544      OT LONG TERM GOAL #1   Title  Pt will return to highest level of functioning using RUE as dominant during B/IADLs.     Time  12    Period  Weeks    Status  On-going      OT LONG TERM GOAL #2   Title  Pt will decrease pain in RUE to 2/10 or less to improve ability to perform work tasks using RUE as dominant.     Time  12    Period  Weeks    Status  On-going      OT LONG TERM GOAL #3   Title  Pt will decrease fascial restrictions in RUE to minimal amounts or less to improve mobility required for functional reaching tasks.     Time  12    Period  Weeks    Status  On-going      OT LONG TERM GOAL #4   Title  Pt will increase RUE A/ROM to WNL to improve ability to perform overhead reaching tasks.     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     Time  12    Period  Weeks  Status  On-going            Plan - 12/05/17 1328    Clinical Impression Statement  A: Continued with manual therapy to address fascial restrictions in anterior deltoid and trapezius muscles. Continued with AA/ROM, progressed to er/IR A/ROM as pt has full range and is able to complete without difficulty. Added proximal shoulder strengthening this session, also added therapy ball circles to begin preparation for IR behind back. Verbal cuing for form and technique.     Plan  P: Increase supine repetitions to 15, add proximal shoulder strengthening in standing       Patient will benefit from skilled therapeutic intervention in order to improve the  following deficits and impairments:  Decreased activity tolerance, Decreased strength, Impaired flexibility, Decreased range of motion, Pain, Increased fascial restrictions, Impaired UE functional use  Visit Diagnosis: Other symptoms and signs involving the musculoskeletal system  Acute pain of right shoulder    Problem List Patient Active Problem List   Diagnosis Date Noted  . Grief 04/16/2015  . Mild intermittent asthma 05/09/2014  . Allergic rhinitis 02/25/2013  . Hypertension   . IFG (impaired fasting glucose)   . Hyperlipidemia    Guadelupe Sabin, OTR/L  [phone] 12/05/2017, 1:42 PM  [place] [address] [place], [place], [address] Phone: [phone]   Fax:  [phone]  Name: Devon Mora MRN: [id number] Date of Birth: 05-24-1965

## 2017-12-10 ENCOUNTER — Ambulatory Visit (HOSPITAL_COMMUNITY): Payer: BC Managed Care – PPO

## 2017-12-10 ENCOUNTER — Encounter (HOSPITAL_COMMUNITY): Payer: Self-pay

## 2017-12-10 DIAGNOSIS — R29898 Other symptoms and signs involving the musculoskeletal system: Secondary | ICD-10-CM | POA: Diagnosis not present

## 2017-12-10 DIAGNOSIS — M25511 Pain in right shoulder: Secondary | ICD-10-CM

## 2017-12-10 NOTE — Patient Instructions (Signed)

## 2017-12-10 NOTE — Therapy (Signed)
[place], [place], [address] Phone: [phone]   Fax:  [phone]  Occupational Therapy Treatment  Patient Details  Name: Devon Mora MRN: [id number] Date of Birth: 1965-11-08 Referring Provider: Dr. Elsie Saas   Encounter Date: 12/10/2017  OT End of Session - 12/10/17 1332    Visit Number  13    Number of Visits  24    Date for OT Re-Evaluation  12/25/17    Authorization Type  [employer]    Authorization Time Period  No visit limit    OT Start Time  1307    OT Stop Time  1345    OT Time Calculation (min)  38 min    Activity Tolerance  Patient tolerated treatment well    Behavior During Therapy  [facility] for tasks assessed/performed       Past Medical History:  Diagnosis Date  . Allergic rhinitis   . CTS (carpal tunnel syndrome)   . Hyperlipidemia   . Hypertension   . IFG (impaired fasting glucose)   . Obesity     Past Surgical History:  Procedure Laterality Date  . right rotator cuff    . VASECTOMY      There were no vitals filed for this visit.  Subjective Assessment - 12/10/17 1321    Subjective   S: It feels good today.    Currently in Pain?  No/denies         [facility] OT Assessment - 12/10/17 1322      Assessment   Medical Diagnosis  s/p complex R RTC repair      Precautions   Precautions  Shoulder    Type of Shoulder Precautions  progress to AA/ROM. See protocol for specifics.                OT Treatments/Exercises (OP) - 12/10/17 1322      Exercises   Exercises  Shoulder      Shoulder Exercises: Supine   Protraction  PROM;5 reps;AROM;12 reps    Horizontal ABduction  PROM;5 reps;AAROM;15 reps    External Rotation  PROM;5 reps;AROM;15 reps    Internal Rotation  PROM;5 reps;AROM;15 reps    Flexion  PROM;5 reps;AAROM;15 reps    ABduction  PROM;5 reps;AAROM;15 reps      Shoulder Exercises: Standing   Protraction  AROM;10 reps    Horizontal ABduction   AROM;10 reps    External Rotation  AROM;10 reps    Internal Rotation  AROM;10 reps    Flexion  AROM;10 reps    ABduction  AROM;10 reps    Extension  Theraband;15 reps    Theraband Level (Shoulder Extension)  Level 2 (Red)    Row  Theraband;15 reps    Theraband Level (Shoulder Row)  Level 2 (Red)    Retraction  Theraband;15 reps    Theraband Level (Shoulder Retraction)  Level 2 (Red)      Shoulder Exercises: Therapy Ball   Right/Left  5 reps      Shoulder Exercises: ROM/Strengthening   UBE (Upper Arm Bike)  level 1 2' forward 2' reverse    pace: 4.0-4.5   Proximal Shoulder Strengthening, Supine  10X each no rest breaks    Proximal Shoulder Strengthening, Seated  10X with 1 rest break       Manual Therapy   Manual Therapy  Myofascial release    Manual therapy comments  manual therapy completed seperately from all other interventions  Myofascial Release  myofascial release and manual stretching to right upper arm, scapular, and shoudler region to decrease pain and fascial restrctions and improve pain free mobility in right shoulder region required for use of right arm with functional activities.              OT Education - 12/10/17 1342    Education Details  Standing A/ROM exercises. Pt is to stop AA/ROM exercises.     Person(s) Educated  Patient    Methods  Explanation;Demonstration;Handout;Verbal cues    Comprehension  Verbalized understanding;Returned demonstration       OT Short Term Goals - 11/26/17 1319      OT SHORT TERM GOAL #1   Title  Pt will be provided with and educated on HEP to improve use of RUE as dominant during ADLs.     Time  6    Period  Weeks    Status  On-going      OT SHORT TERM GOAL #2   Title  Pt will improve RUE P/ROM to [facility] to improve ability to perform dressing tasks.     Time  6    Period  Weeks    Status  On-going      OT SHORT TERM GOAL #3   Title  Pt will improve RUE strength to 3-/5 to improve ability to reach items at waist to  shoulder level.     Time  6    Period  Weeks    Status  On-going      OT SHORT TERM GOAL #4   Title  Pt will decrease pain in RUE to no greater than 4/10 to improve ability to sleep in the bed.     Time  6    Period  Weeks        OT Long Term Goals - 10/29/17 1544      OT LONG TERM GOAL #1   Title  Pt will return to highest level of functioning using RUE as dominant during B/IADLs.     Time  12    Period  Weeks    Status  On-going      OT LONG TERM GOAL #2   Title  Pt will decrease pain in RUE to 2/10 or less to improve ability to perform work tasks using RUE as dominant.     Time  12    Period  Weeks    Status  On-going      OT LONG TERM GOAL #3   Title  Pt will decrease fascial restrictions in RUE to minimal amounts or less to improve mobility required for functional reaching tasks.     Time  12    Period  Weeks    Status  On-going      OT LONG TERM GOAL #4   Title  Pt will increase RUE A/ROM to WNL to improve ability to perform overhead reaching tasks.     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     Time  12    Period  Weeks    Status  On-going            Plan - 12/10/17 1332    Clinical Impression Statement  A: Trace fascial  restrictions palpated during session. Passive ROM was slightly tight at end range although functional. Pt was able to complete standing shoulder exercises for A/ROM. Supine required assistance from pvc pipe for some. VC for form and technique as needed.     Plan  P: Complete myofascial release if needed. Attempt X to V arms.    Consulted and Agree with Plan of Care  Patient       Patient will benefit from skilled therapeutic intervention in order to improve the following deficits and impairments:  Decreased activity tolerance, Decreased strength, Impaired flexibility,  Decreased range of motion, Pain, Increased fascial restrictions, Impaired UE functional use  Visit Diagnosis: Other symptoms and signs involving the musculoskeletal system  Acute pain of right shoulder    Problem List Patient Active Problem List   Diagnosis Date Noted  . Grief 04/16/2015  . Mild intermittent asthma 05/09/2014  . Allergic rhinitis 02/25/2013  . Hypertension   . IFG (impaired fasting glucose)   . Hyperlipidemia    Ailene Ravel, OTR/L,CBIS  [phone]  12/10/2017, 1:48 PM  [place] [address] [place], [place], [address] Phone: [phone]   Fax:  [phone]  Name: Devon Mora MRN: [id number] Date of Birth: 04-14-1966

## 2017-12-12 ENCOUNTER — Ambulatory Visit (HOSPITAL_COMMUNITY): Payer: BC Managed Care – PPO | Admitting: Occupational Therapy

## 2017-12-12 ENCOUNTER — Encounter (HOSPITAL_COMMUNITY): Payer: Self-pay | Admitting: Occupational Therapy

## 2017-12-12 DIAGNOSIS — R29898 Other symptoms and signs involving the musculoskeletal system: Secondary | ICD-10-CM | POA: Diagnosis not present

## 2017-12-12 DIAGNOSIS — M25511 Pain in right shoulder: Secondary | ICD-10-CM

## 2017-12-12 NOTE — Therapy (Addendum)
[place], [place], [address] Phone: [phone]   Fax:  [phone]  Occupational Therapy Treatment  Patient Details  Name: Devon Mora MRN: [id number] Date of Birth: 05/16/1965 Referring Provider: Dr. Elsie Saas   Encounter Date: 12/12/2017  OT End of Session - 12/12/17 1353    Visit Number  14    Number of Visits  24    Date for OT Re-Evaluation  12/25/17    Authorization Type  [employer]    Authorization Time Period  No visit limit    OT Start Time  1303    OT Stop Time  1349    OT Time Calculation (min)  46 min    Activity Tolerance  Patient tolerated treatment well    Behavior During Therapy  [facility] for tasks assessed/performed       Past Medical History:  Diagnosis Date  . Allergic rhinitis   . CTS (carpal tunnel syndrome)   . Hyperlipidemia   . Hypertension   . IFG (impaired fasting glucose)   . Obesity     Past Surgical History:  Procedure Laterality Date  . right rotator cuff    . VASECTOMY      There were no vitals filed for this visit.  Subjective Assessment - 12/12/17 1305    Subjective   S: I got my belt on this morning.     Currently in Pain?  No/denies         [facility] OT Assessment - 12/12/17 1305      Assessment   Medical Diagnosis  s/p complex R RTC repair      Precautions   Precautions  Shoulder    Type of Shoulder Precautions  progress to AA/ROM. See protocol for specifics.                OT Treatments/Exercises (OP) - 12/12/17 1306      Exercises   Exercises  Shoulder      Shoulder Exercises: Supine   Protraction  PROM;5 reps;AROM;15 reps    Horizontal ABduction  PROM;5 reps;AROM;15 reps    External Rotation  PROM;5 reps;AROM;15 reps    Internal Rotation  PROM;5 reps;AROM;15 reps    Flexion  PROM;5 reps;AAROM;15 reps    ABduction  PROM;5 reps;AAROM;15 reps      Shoulder Exercises: Standing   Protraction  AROM;10 reps    Horizontal  ABduction  AROM;10 reps    External Rotation  AROM;10 reps    Internal Rotation  AROM;10 reps    Flexion  AROM;10 reps    ABduction  AROM;10 reps    Extension  Theraband;15 reps    Theraband Level (Shoulder Extension)  Level 2 (Red)    Row  Theraband;15 reps    Theraband Level (Shoulder Row)  Level 2 (Red)    Retraction  Theraband;15 reps    Theraband Level (Shoulder Retraction)  Level 2 (Red)    Other Standing Exercises  pt passed cone around behind back 10X working on IT      Shoulder Exercises: ROM/Strengthening   UBE (Upper Arm Bike)  level 1 2' forward 2' reverse    pace: 4.0-4.5   X to V Arms  10X    Proximal Shoulder Strengthening, Supine  10X each no rest breaks    Proximal Shoulder Strengthening, Seated  10X with 1 rest break       Manual Therapy   Manual Therapy  Myofascial release  Manual therapy comments  manual therapy completed seperately from all other interventions    Myofascial Release  myofascial release and manual stretching to right upper arm, scapular, and shoudler region to decrease pain and fascial restrctions and improve pain free mobility in right shoulder region required for use of right arm with functional activities.                OT Short Term Goals - 11/26/17 1319      OT SHORT TERM GOAL #1   Title  Pt will be provided with and educated on HEP to improve use of RUE as dominant during ADLs.     Time  6    Period  Weeks    Status  On-going      OT SHORT TERM GOAL #2   Title  Pt will improve RUE P/ROM to [facility] to improve ability to perform dressing tasks.     Time  6    Period  Weeks    Status  On-going      OT SHORT TERM GOAL #3   Title  Pt will improve RUE strength to 3-/5 to improve ability to reach items at waist to shoulder level.     Time  6    Period  Weeks    Status  On-going      OT SHORT TERM GOAL #4   Title  Pt will decrease pain in RUE to no greater than 4/10 to improve ability to sleep in the bed.     Time  6    Period   Weeks        OT Long Term Goals - 10/29/17 1544      OT LONG TERM GOAL #1   Title  Pt will return to highest level of functioning using RUE as dominant during B/IADLs.     Time  12    Period  Weeks    Status  On-going      OT LONG TERM GOAL #2   Title  Pt will decrease pain in RUE to 2/10 or less to improve ability to perform work tasks using RUE as dominant.     Time  12    Period  Weeks    Status  On-going      OT LONG TERM GOAL #3   Title  Pt will decrease fascial restrictions in RUE to minimal amounts or less to improve mobility required for functional reaching tasks.     Time  12    Period  Weeks    Status  On-going      OT LONG TERM GOAL #4   Title  Pt will increase RUE A/ROM to WNL to improve ability to perform overhead reaching tasks.     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     Time  12    Period  Weeks    Status  On-going            Plan - 12/12/17 1353    Clinical Impression Statement  A: Pt reporting he was able to thread his belt through the loops today. Pt able to complete horizontal abduction A/ROM in supine today, continues to require dowel rod for flexion and abduction in supine.  Added x to v arms today, pt able to complete with moderate effort. Continue with scapular theraband and updated for HEP. Verbal cuing required for form and technique.     Plan  P: measure for MD appt & send measurements with pt. manual therapy prn, add towel stretch for IR       Patient will benefit from skilled therapeutic intervention in order to improve the following deficits and impairments:  Decreased activity tolerance, Decreased strength, Impaired flexibility, Decreased range of motion, Pain, Increased fascial restrictions, Impaired UE functional use  Visit Diagnosis: Other symptoms and signs involving  the musculoskeletal system  Acute pain of right shoulder    Problem List Patient Active Problem List   Diagnosis Date Noted  . Grief 04/16/2015  . Mild intermittent asthma 05/09/2014  . Allergic rhinitis 02/25/2013  . Hypertension   . IFG (impaired fasting glucose)   . Hyperlipidemia    Guadelupe Sabin, OTR/L  [phone] 12/12/2017, 1:57 PM  [place] [address] [place], [place], [address] Phone: [phone]   Fax:  [phone]  Name: Devon Mora MRN: [id number] Date of Birth: 1965-09-08

## 2017-12-12 NOTE — Patient Instructions (Signed)

## 2017-12-17 ENCOUNTER — Ambulatory Visit (HOSPITAL_COMMUNITY): Payer: BC Managed Care – PPO | Admitting: Specialist

## 2017-12-17 ENCOUNTER — Encounter (HOSPITAL_COMMUNITY): Payer: Self-pay | Admitting: Specialist

## 2017-12-17 DIAGNOSIS — R29898 Other symptoms and signs involving the musculoskeletal system: Secondary | ICD-10-CM

## 2017-12-17 DIAGNOSIS — M25511 Pain in right shoulder: Secondary | ICD-10-CM

## 2017-12-17 NOTE — Therapy (Signed)
[place], [place], [address] Phone: [phone]   Fax:  [phone]  Occupational Therapy Treatment  Patient Details  Name: Devon Mora MRN: [id number] Date of Birth: 08/07/1965 Referring Provider: Dr. Elsie Saas   Encounter Date: 12/17/2017  OT End of Session - 12/17/17 1059    Visit Number  15    Number of Visits  24    Date for OT Re-Evaluation  12/25/17    Authorization Type  [place] PPO    Authorization Time Period  No visit limit    OT Start Time  1036    OT Stop Time  1120    OT Time Calculation (min)  44 min    Activity Tolerance  Patient tolerated treatment well    Behavior During Therapy  [facility] for tasks assessed/performed       Past Medical History:  Diagnosis Date  . Allergic rhinitis   . CTS (carpal tunnel syndrome)   . Hyperlipidemia   . Hypertension   . IFG (impaired fasting glucose)   . Obesity     Past Surgical History:  Procedure Laterality Date  . right rotator cuff    . VASECTOMY      There were no vitals filed for this visit.  Subjective Assessment - 12/17/17 1058    Subjective   S:  I think the cold weather made it a bit sore.  The biggest problem I have is strength in my arm, I dont have much yet.     Currently in Pain?  Yes    Pain Score  2     Pain Location  Shoulder    Pain Orientation  Right    Pain Descriptors / Indicators  Aching         [facility] OT Assessment - 12/17/17 0001      Assessment   Medical Diagnosis  s/p complex R RTC repair    Referring Provider  Dr. Elsie Saas      Precautions   Precautions  Shoulder    Type of Shoulder Precautions  see protocol for specifics      ROM / Strength   AROM / PROM / Strength  AROM;PROM;Strength      Palpation   Palpation comment  min fascial restrictions       AROM   Overall AROM Comments  assessed in seated, external and internal rotation with shoulder adducted    AROM Assessment Site   Shoulder    Right/Left Shoulder  Right    Right Shoulder Flexion  155 Degrees    Right Shoulder ABduction  145 Degrees    Right Shoulder Internal Rotation  80 Degrees    Right Shoulder External Rotation  78 Degrees      PROM   Overall PROM Comments  WNL in supine      Strength   Strength Assessment Site  Shoulder    Right/Left Shoulder  Right    Right Shoulder Flexion  4/5    Right Shoulder ABduction  4/5    Right Shoulder Internal Rotation  4-/5    Right Shoulder External Rotation  4-/5               OT Treatments/Exercises (OP) - 12/17/17 0001      Exercises   Exercises  Shoulder      Shoulder Exercises: Supine   Protraction  PROM;5 reps    Horizontal ABduction  PROM;5 reps    External  Rotation  PROM;5 reps    Internal Rotation  PROM;5 reps    Flexion  PROM;5 reps    ABduction  PROM;5 reps      Shoulder Exercises: Standing   Protraction  AROM;15 reps    Horizontal ABduction  AROM;15 reps    External Rotation  AROM;15 reps    Internal Rotation  AROM;15 reps    Flexion  AROM;15 reps    ABduction  AROM;15 reps    Extension  Theraband;15 reps    Theraband Level (Shoulder Extension)  Level 3 (Green)    Row  [employer] reps    Theraband Level (Shoulder Row)  Level 3 (Green)    Retraction  Theraband;15 reps    Theraband Level (Shoulder Retraction)  Level 3 (Green)      Shoulder Exercises: ROM/Strengthening   UBE (Upper Arm Bike)  level 1 3' forward and 3' reverse    "W" Arms  10 X with min verbal guidance  for technique     X to V Arms  10X      Shoulder Exercises: Stretch   Internal Rotation Stretch  5 reps      Manual Therapy   Manual Therapy  Myofascial release    Manual therapy comments  manual therapy completed seperately from all other interventions    Myofascial Release  myofascial release and manual stretching to right upper arm, scapular, and shoudler region to decrease pain and fascial restrctions and improve pain free mobility in right  shoulder region required for use of right arm with functional activities.                OT Short Term Goals - 12/17/17 1103      OT SHORT TERM GOAL #1   Title  Pt will be provided with and educated on HEP to improve use of RUE as dominant during ADLs.     Time  6    Period  Weeks    Status  On-going      OT SHORT TERM GOAL #2   Title  Pt will improve RUE P/ROM to [facility] to improve ability to perform dressing tasks.     Time  6    Period  Weeks    Status  Achieved      OT SHORT TERM GOAL #3   Title  Pt will improve RUE strength to 3-/5 to improve ability to reach items at waist to shoulder level.     Time  6    Period  Weeks    Status  Achieved      OT SHORT TERM GOAL #4   Title  Pt will decrease pain in RUE to no greater than 4/10 to improve ability to sleep in the bed.     Time  6    Period  Weeks    Status  Achieved        OT Long Term Goals - 12/17/17 1103      OT LONG TERM GOAL #1   Title  Pt will return to highest level of functioning using RUE as dominant during B/IADLs.     Time  12    Period  Weeks    Status  On-going      OT LONG TERM GOAL #2   Title  Pt will decrease pain in RUE to 2/10 or less to improve ability to perform work tasks using RUE as dominant.     Time  12    Period  Weeks  Status  On-going      OT LONG TERM GOAL #3   Title  Pt will decrease fascial restrictions in RUE to minimal amounts or less to improve mobility required for functional reaching tasks.     Time  12    Period  Weeks    Status  On-going      OT LONG TERM GOAL #4   Title  Pt will increase RUE A/ROM to WNL to improve ability to perform overhead reaching tasks.     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     Time  12    Period  Weeks    Status  On-going            Plan - 12/17/17 1100     Clinical Impression Statement  A:  Patient is progressing well towards his goals in occupational therapy.  He has WNL P/ROM in supine.  A/ROM in standing is [facility], however end range A/ROM is still painful and not fluid.  Strength is good -.  Patient is able to complete most household activities at waist to shoulder height.  Reaching and placing items overhead is painful and difficult.  Patient will beneift from continued skilled OT intervention for improved end range movement, increased strength, and sustained activity tolerance.      OT Frequency  2x / week    OT Duration  4 weeks    OT Treatment/Interventions  Self-care/ADL training;Therapeutic exercise;Ultrasound;Manual Therapy;Therapeutic activities;Cryotherapy;Electrical Stimulation;Moist Heat;Passive range of motion;Patient/family education    Plan  P:  Continue skilled OT, focus on end range A/ROM with less pain, improved strength in order to return to work.  Complete manual therapy as needed.  Attempt sidelying A/ROM and add functional work activities.        Patient will benefit from skilled therapeutic intervention in order to improve the following deficits and impairments:  Decreased activity tolerance, Decreased strength, Impaired flexibility, Decreased range of motion, Pain, Increased fascial restrictions, Impaired UE functional use  Visit Diagnosis: Other symptoms and signs involving the musculoskeletal system  Acute pain of right shoulder    Problem List Patient Active Problem List   Diagnosis Date Noted  . Grief 04/16/2015  . Mild intermittent asthma 05/09/2014  . Allergic rhinitis 02/25/2013  . Hypertension   . IFG (impaired fasting glucose)   . Hyperlipidemia     Vangie Bicker, [place], OTR/L [phone]  12/17/2017, 11:11 AM  [place] [address] [place], [place], [address] Phone: [phone]   Fax:  [phone]  Name: Devon Mora MRN: [id number] Date of Birth:  Oct 20, 1965

## 2017-12-19 ENCOUNTER — Encounter (HOSPITAL_COMMUNITY): Payer: Self-pay | Admitting: Occupational Therapy

## 2017-12-19 ENCOUNTER — Ambulatory Visit (HOSPITAL_COMMUNITY): Payer: BC Managed Care – PPO | Admitting: Occupational Therapy

## 2017-12-19 DIAGNOSIS — R29898 Other symptoms and signs involving the musculoskeletal system: Secondary | ICD-10-CM | POA: Diagnosis not present

## 2017-12-19 DIAGNOSIS — M25511 Pain in right shoulder: Secondary | ICD-10-CM

## 2017-12-19 NOTE — Therapy (Signed)
[place], [place], [address] Phone: [phone]   Fax:  [phone]  Occupational Therapy Treatment  Patient Details  Name: Devon Mora MRN: [id number] Date of Birth: December 07, 1965 Referring Provider: Dr. Elsie Saas   Encounter Date: 12/19/2017  OT End of Session - 12/19/17 1526    Visit Number  16    Number of Visits  24    Date for OT Re-Evaluation  12/25/17    Authorization Type  [place] PPO    Authorization Time Period  No visit limit    OT Start Time  1302    OT Stop Time  1344    OT Time Calculation (min)  42 min    Activity Tolerance  Patient tolerated treatment well    Behavior During Therapy  [facility] for tasks assessed/performed       Past Medical History:  Diagnosis Date  . Allergic rhinitis   . CTS (carpal tunnel syndrome)   . Hyperlipidemia   . Hypertension   . IFG (impaired fasting glucose)   . Obesity     Past Surgical History:  Procedure Laterality Date  . right rotator cuff    . VASECTOMY      There were no vitals filed for this visit.  Subjective Assessment - 12/19/17 1300    Subjective   S: The doctor said I can go back to work on light duty if I wanted.     Currently in Pain?  No/denies         [facility] OT Assessment - 12/19/17 1300      Assessment   Medical Diagnosis  s/p complex R RTC repair      Precautions   Precautions  Shoulder    Type of Shoulder Precautions  no lifting over 5#              OT Treatments/Exercises (OP) - 12/19/17 1305      Exercises   Exercises  Shoulder      Shoulder Exercises: Supine   Protraction  PROM;5 reps    Horizontal ABduction  PROM;5 reps    External Rotation  PROM;5 reps    Internal Rotation  PROM;5 reps    Flexion  PROM;5 reps    ABduction  PROM;5 reps      Shoulder Exercises: Sidelying   External Rotation  AROM;12 reps    Internal Rotation  AROM;12 reps    Flexion  AROM;12 reps    ABduction  AROM;12  reps    Other Sidelying Exercises  protraction, 12X, A/ROM    Other Sidelying Exercises  horizontal abduction, A/ROM, 12X      Shoulder Exercises: Standing   Protraction  Strengthening;12 reps    Protraction Weight (lbs)  1    Horizontal ABduction  Strengthening;12 reps    Horizontal ABduction Weight (lbs)  1    External Rotation  Strengthening;12 reps    External Rotation Weight (lbs)  1    External Rotation Limitations  abducted    Internal Rotation  Strengthening;12 reps    Internal Rotation Weight (lbs)  1    Internal Rotation Limitations  abducted    Flexion  Strengthening;12 reps    Shoulder Flexion Weight (lbs)  1    ABduction  Strengthening;12 reps    Shoulder ABduction Weight (lbs)  1    Extension  Theraband;15 reps    Theraband Level (Shoulder Extension)  Level 3 (Green)    Row  [employer]  reps    Theraband Level (Shoulder Row)  Level 3 (Green)    Retraction  Theraband;15 reps    Theraband Level (Shoulder Retraction)  Level 3 (Green)      Shoulder Exercises: ROM/Strengthening   "W" Arms  10X    X to V Arms  10X, 1#    Ball on Wall  1' flexion, 1' abduction      Shoulder Exercises: Stretch   Internal Rotation Stretch  3 reps   20" each, horizontal towel     Manual Therapy   Manual Therapy  Myofascial release    Manual therapy comments  manual therapy completed seperately from all other interventions    Myofascial Release  myofascial release and manual stretching to right upper arm, scapular, and shoudler region to decrease pain and fascial restrctions and improve pain free mobility in right shoulder region required for use of right arm with functional activities.                OT Short Term Goals - 12/17/17 1103      OT SHORT TERM GOAL #1   Title  Pt will be provided with and educated on HEP to improve use of RUE as dominant during ADLs.     Time  6    Period  Weeks    Status  On-going      OT SHORT TERM GOAL #2   Title  Pt will improve RUE P/ROM  to [facility] to improve ability to perform dressing tasks.     Time  6    Period  Weeks    Status  Achieved      OT SHORT TERM GOAL #3   Title  Pt will improve RUE strength to 3-/5 to improve ability to reach items at waist to shoulder level.     Time  6    Period  Weeks    Status  Achieved      OT SHORT TERM GOAL #4   Title  Pt will decrease pain in RUE to no greater than 4/10 to improve ability to sleep in the bed.     Time  6    Period  Weeks    Status  Achieved        OT Long Term Goals - 12/17/17 1103      OT LONG TERM GOAL #1   Title  Pt will return to highest level of functioning using RUE as dominant during B/IADLs.     Time  12    Period  Weeks    Status  On-going      OT LONG TERM GOAL #2   Title  Pt will decrease pain in RUE to 2/10 or less to improve ability to perform work tasks using RUE as dominant.     Time  12    Period  Weeks    Status  On-going      OT LONG TERM GOAL #3   Title  Pt will decrease fascial restrictions in RUE to minimal amounts or less to improve mobility required for functional reaching tasks.     Time  12    Period  Weeks    Status  On-going      OT LONG TERM GOAL #4   Title  Pt will increase RUE A/ROM to WNL to improve ability to perform overhead reaching tasks.     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     Time  12    Period  Weeks    Status  On-going            Plan - 12/19/17 1526    Clinical Impression Statement  A: Pt reports MD appt went well, pt is now on a 5# lifting restrictions. Continued with manual therapy to address fascial restrictions in anterior deltoid, upper trapezius, and scapularis regions. Progressed to A/ROM in sidelying and added 1# weights to standing exercises. Also added ball on wall and continued with w arms working on scapular stability.  Verbal cuing for form and technique.     Plan  P: Continue working on improving IR, add functional work activity with light weight       Patient will benefit from skilled therapeutic intervention in order to improve the following deficits and impairments:  Decreased activity tolerance, Decreased strength, Impaired flexibility, Decreased range of motion, Pain, Increased fascial restrictions, Impaired UE functional use  Visit Diagnosis: Other symptoms and signs involving the musculoskeletal system  Acute pain of right shoulder    Problem List Patient Active Problem List   Diagnosis Date Noted  . Grief 04/16/2015  . Mild intermittent asthma 05/09/2014  . Allergic rhinitis 02/25/2013  . Hypertension   . IFG (impaired fasting glucose)   . Hyperlipidemia    Guadelupe Sabin, OTR/L  [phone] 12/19/2017, 3:28 PM  [place] [address] [place], [place], [address] Phone: [phone]   Fax:  [phone]  Name: Devon Mora MRN: [id number] Date of Birth: 15-Jun-1965

## 2017-12-25 ENCOUNTER — Ambulatory Visit (HOSPITAL_COMMUNITY): Payer: BC Managed Care – PPO | Attending: Orthopedic Surgery | Admitting: Occupational Therapy

## 2017-12-25 ENCOUNTER — Encounter (HOSPITAL_COMMUNITY): Payer: Self-pay | Admitting: Occupational Therapy

## 2017-12-25 DIAGNOSIS — M25511 Pain in right shoulder: Secondary | ICD-10-CM | POA: Diagnosis present

## 2017-12-25 DIAGNOSIS — R29898 Other symptoms and signs involving the musculoskeletal system: Secondary | ICD-10-CM

## 2017-12-25 NOTE — Therapy (Signed)
[place], [place], [address] Phone: [phone]   Fax:  [phone]  Occupational Therapy Treatment  Patient Details  Name: Devon Mora MRN: [id number] Date of Birth: 09/12/1965 Referring Provider: Dr. Elsie Saas   Encounter Date: 12/25/2017  OT End of Session - 12/25/17 1436    Visit Number  17    Number of Visits  24    Date for OT Re-Evaluation  12/25/17    Authorization Type  [place] PPO    Authorization Time Period  No visit limit    OT Start Time  1343    OT Stop Time  1429    OT Time Calculation (min)  46 min    Activity Tolerance  Patient tolerated treatment well    Behavior During Therapy  [facility] for tasks assessed/performed       Past Medical History:  Diagnosis Date  . Allergic rhinitis   . CTS (carpal tunnel syndrome)   . Hyperlipidemia   . Hypertension   . IFG (impaired fasting glucose)   . Obesity     Past Surgical History:  Procedure Laterality Date  . right rotator cuff    . VASECTOMY      There were no vitals filed for this visit.  Subjective Assessment - 12/25/17 1434    Subjective   S: I'm feeling fine today.     Currently in Pain?  No/denies                   OT Treatments/Exercises (OP) - 12/25/17 1345      Exercises   Exercises  Shoulder      Shoulder Exercises: Supine   Other Supine Exercises  serratus anterior punch with orange weighted ball       Shoulder Exercises: Sidelying   External Rotation  Strengthening;12 reps;Weights    External Rotation Weight (lbs)  1    Internal Rotation  Strengthening;12 reps;Weights    Internal Rotation Weight (lbs)  1    Flexion  Strengthening;12 reps;Weights    Flexion Weight (lbs)  1    ABduction  Strengthening;12 reps;Weights    ABduction Weight (lbs)  1    Other Sidelying Exercises  protraction, 12X, 1#    Other Sidelying Exercises  horizontal abduction, 12X, 1#      Shoulder Exercises: Standing    Protraction  Strengthening;15 reps;Weights    Protraction Weight (lbs)  1    Horizontal ABduction  Strengthening;15 reps;Weights    Horizontal ABduction Weight (lbs)  1    External Rotation  Strengthening;Weights;12 reps    External Rotation Weight (lbs)  1    External Rotation Limitations  abducted    Internal Rotation  Strengthening;Weights;12 reps    Internal Rotation Weight (lbs)  1    Internal Rotation Limitations  abducted    Flexion  Strengthening;15 reps;Weights    Shoulder Flexion Weight (lbs)  1    ABduction  Strengthening;15 reps;Weights    Shoulder ABduction Weight (lbs)  1      Shoulder Exercises: Therapy Ball   Other Therapy Ball Exercises  green therapy ball: flexion, circles each direction, diagonals each direction 12X each      Shoulder Exercises: ROM/Strengthening   "W" Arms  12X, 1#    X to V Arms  12X, 1#    Ball on Wall  1' flexion, 1' abduction      Manual Therapy   Manual Therapy  Myofascial release  Manual therapy comments  manual therapy completed seperately from all other interventions    Myofascial Release  myofascial release and manual stretching to right upper arm, scapular, and shoudler region to decrease pain and fascial restrctions and improve pain free mobility in right shoulder region required for use of right arm with functional activities.                OT Short Term Goals - 12/17/17 1103      OT SHORT TERM GOAL #1   Title  Pt will be provided with and educated on HEP to improve use of RUE as dominant during ADLs.     Time  6    Period  Weeks    Status  On-going      OT SHORT TERM GOAL #2   Title  Pt will improve RUE P/ROM to [facility] to improve ability to perform dressing tasks.     Time  6    Period  Weeks    Status  Achieved      OT SHORT TERM GOAL #3   Title  Pt will improve RUE strength to 3-/5 to improve ability to reach items at waist to shoulder level.     Time  6    Period  Weeks    Status  Achieved      OT SHORT  TERM GOAL #4   Title  Pt will decrease pain in RUE to no greater than 4/10 to improve ability to sleep in the bed.     Time  6    Period  Weeks    Status  Achieved        OT Long Term Goals - 12/17/17 1103      OT LONG TERM GOAL #1   Title  Pt will return to highest level of functioning using RUE as dominant during B/IADLs.     Time  12    Period  Weeks    Status  On-going      OT LONG TERM GOAL #2   Title  Pt will decrease pain in RUE to 2/10 or less to improve ability to perform work tasks using RUE as dominant.     Time  12    Period  Weeks    Status  On-going      OT LONG TERM GOAL #3   Title  Pt will decrease fascial restrictions in RUE to minimal amounts or less to improve mobility required for functional reaching tasks.     Time  12    Period  Weeks    Status  On-going      OT LONG TERM GOAL #4   Title  Pt will increase RUE A/ROM to WNL to improve ability to perform overhead reaching tasks.     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     Time  12    Period  Weeks    Status  On-going            Plan - 12/25/17 1437    Clinical Impression Statement  A: Continued with manual therapy for fascial restrictions in anterior deltoid, scapularis region, and upper trapezius. Added 1# weights in sidelying exercises and continued with 1# weights for standing  exercises with mod fatigue at end of task. Continued with ball on wall exercises and added large therapy ball exercises for scapular stability, activity tolerance, and for functional work task simulation. Verbal cues given by OT for form and technique.     Plan  P: Continue with functional work activities with light weight using weighted box placing and removing from top of fridge.        Patient will benefit from skilled therapeutic intervention in order  to improve the following deficits and impairments:  Decreased activity tolerance, Decreased strength, Impaired flexibility, Decreased range of motion, Pain, Increased fascial restrictions, Impaired UE functional use  Visit Diagnosis: Other symptoms and signs involving the musculoskeletal system  Acute pain of right shoulder    Problem List Patient Active Problem List   Diagnosis Date Noted  . Grief 04/16/2015  . Mild intermittent asthma 05/09/2014  . Allergic rhinitis 02/25/2013  . Hypertension   . IFG (impaired fasting glucose)   . Hyperlipidemia    Guadelupe Sabin, OTR/L  [phone] 12/25/2017, 3:13 PM  [place] [address] [place], [place], [address] Phone: [phone]   Fax:  [phone]  Name: Devon Mora MRN: [id number] Date of Birth: Sep 02, 1965

## 2017-12-27 ENCOUNTER — Ambulatory Visit (HOSPITAL_COMMUNITY): Payer: BC Managed Care – PPO | Admitting: Occupational Therapy

## 2017-12-27 ENCOUNTER — Ambulatory Visit (INDEPENDENT_AMBULATORY_CARE_PROVIDER_SITE_OTHER): Payer: BC Managed Care – PPO | Admitting: Otolaryngology

## 2017-12-27 ENCOUNTER — Encounter (HOSPITAL_COMMUNITY): Payer: Self-pay | Admitting: Occupational Therapy

## 2017-12-27 DIAGNOSIS — R29898 Other symptoms and signs involving the musculoskeletal system: Secondary | ICD-10-CM

## 2017-12-27 DIAGNOSIS — H903 Sensorineural hearing loss, bilateral: Secondary | ICD-10-CM

## 2017-12-27 DIAGNOSIS — M25511 Pain in right shoulder: Secondary | ICD-10-CM

## 2017-12-27 DIAGNOSIS — H9313 Tinnitus, bilateral: Secondary | ICD-10-CM

## 2017-12-27 NOTE — Therapy (Addendum)
[place], [place], [address] Phone: [phone]   Fax:  [phone]  Occupational Therapy Treatment  Patient Details  Name: Devon Mora MRN: [id number] Date of Birth: [birth year] Referring Provider: Dr. Elsie Saas   Encounter Date: 12/27/2017  OT End of Session - 12/27/17 1355    Visit Number  18    Number of Visits  24    Date for OT Re-Evaluation  12/25/17    Authorization Type  [employer] [place] PPO    Authorization Time Period  No visit limit    OT Start Time  1300    OT Stop Time  1345    OT Time Calculation (min)  45 min    Activity Tolerance  Patient tolerated treatment well    Behavior During Therapy  [facility] for tasks assessed/performed       Past Medical History:  Diagnosis Date  . Allergic rhinitis   . CTS (carpal tunnel syndrome)   . Hyperlipidemia   . Hypertension   . IFG (impaired fasting glucose)   . Obesity     Past Surgical History:  Procedure Laterality Date  . right rotator cuff    . VASECTOMY      There were no vitals filed for this visit.  Subjective Assessment - 12/27/17 1354    Subjective   S: I'm still having some pain in my upper arm.     Currently in Pain?  No/denies         [facility] OT Assessment - 12/27/17 1426      Assessment   Medical Diagnosis  s/p complex R RTC repair      Precautions   Precautions  Shoulder    Type of Shoulder Precautions  no lifting over 5#               OT Treatments/Exercises (OP) - 12/27/17 1319      Exercises   Exercises  Shoulder      Shoulder Exercises: Supine   Protraction  PROM;5 reps    Horizontal ABduction  PROM;5 reps    External Rotation  PROM;5 reps    Internal Rotation  PROM;5 reps    Flexion  PROM;5 reps    ABduction  PROM;5 reps      Shoulder Exercises: Sidelying   External Rotation  Strengthening;15 reps;Weights    External Rotation Weight (lbs)  1    Internal Rotation  Strengthening;15 reps;Weights     Internal Rotation Weight (lbs)  1    Flexion  Strengthening;15 reps;Weights    Flexion Weight (lbs)  1    ABduction  Strengthening;15 reps;Weights    ABduction Weight (lbs)  1    Other Sidelying Exercises  protraction, 15X, 1#    Other Sidelying Exercises  horizontal abduction, 15X, 1#      Shoulder Exercises: Standing   Protraction  Strengthening;15 reps;Weights    Protraction Weight (lbs)  1    Horizontal ABduction  Strengthening;15 reps;Weights    Horizontal ABduction Weight (lbs)  1    External Rotation  Strengthening;Weights;12 reps    External Rotation Weight (lbs)  1    External Rotation Limitations  abducted    Internal Rotation  Strengthening;Weights;15 reps    Internal Rotation Weight (lbs)  1    Flexion  Strengthening;15 reps;Weights    Shoulder Flexion Weight (lbs)  1    ABduction  Strengthening;15 reps;Weights    Shoulder ABduction Weight (lbs)  1  Shoulder Exercises: Therapy Ball   Other Therapy Ball Exercises  green therapy ball 1# wrist weights: flexion, circles each direction, diagonals each direction 10X each      Shoulder Exercises: ROM/Strengthening   Other ROM/Strengthening Exercises  overhead carry 3# for 2'      Shoulder Exercises: Stretch   Other Shoulder Stretches  [place] stretch with PVC pipe 10X    Other Shoulder Stretches  Using half circle foam roll: horizontal abduction, flexion with 2# dowel rod, 1' holds      Functional Reaching Activities   High Level  Pt lifted box with 3# weight and placed on fridge 10X      Manual Therapy   Manual Therapy  Myofascial release    Manual therapy comments  manual therapy completed seperately from all other interventions    Myofascial Release  myofascial release and manual stretching to right upper arm, scapular, and shoudler region to decrease pain and fascial restrctions and improve pain free mobility in right shoulder region required for use of right arm with functional activities.                 OT Short Term Goals - 12/17/17 1103      OT SHORT TERM GOAL #1   Title  Pt will be provided with and educated on HEP to improve use of RUE as dominant during ADLs.     Time  6    Period  Weeks    Status  On-going      OT SHORT TERM GOAL #2   Title  Pt will improve RUE P/ROM to [facility] to improve ability to perform dressing tasks.     Time  6    Period  Weeks    Status  Achieved      OT SHORT TERM GOAL #3   Title  Pt will improve RUE strength to 3-/5 to improve ability to reach items at waist to shoulder level.     Time  6    Period  Weeks    Status  Achieved      OT SHORT TERM GOAL #4   Title  Pt will decrease pain in RUE to no greater than 4/10 to improve ability to sleep in the bed.     Time  6    Period  Weeks    Status  Achieved        OT Long Term Goals - 12/17/17 1103      OT LONG TERM GOAL #1   Title  Pt will return to highest level of functioning using RUE as dominant during B/IADLs.     Time  12    Period  Weeks    Status  On-going      OT LONG TERM GOAL #2   Title  Pt will decrease pain in RUE to 2/10 or less to improve ability to perform work tasks using RUE as dominant.     Time  12    Period  Weeks    Status  On-going      OT LONG TERM GOAL #3   Title  Pt will decrease fascial restrictions in RUE to minimal amounts or less to improve mobility required for functional reaching tasks.     Time  12    Period  Weeks    Status  On-going      OT LONG TERM GOAL #4   Title  Pt will increase RUE A/ROM to WNL to improve ability  to perform overhead reaching tasks.     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     Time  12    Period  Weeks    Status  On-going            Plan - 12/27/17 1356    Clinical Impression Statement  A: Continued with manual therapy for fascial  restrictions in anterior deltoid, scapularis region, and upper trapezius. Progressed to 2# weights for standing exercises and added 1# wrist weights during large therapy ball exercises. OT also introduced functional reaching task for scapular stability, activity tolerance, and for functional work task simulation. Verbal cues given by OT for form and technique.     Plan  P: Sidelying exercises with red theraband and attempt body blade.        Patient will benefit from skilled therapeutic intervention in order to improve the following deficits and impairments:  Decreased activity tolerance, Decreased strength, Impaired flexibility, Decreased range of motion, Pain, Increased fascial restrictions, Impaired UE functional use  Visit Diagnosis: Acute pain of right shoulder  Other symptoms and signs involving the musculoskeletal system    Problem List Patient Active Problem List   Diagnosis Date Noted  . Grief 04/16/2015  . Mild intermittent asthma 05/09/2014  . Allergic rhinitis 02/25/2013  . Hypertension   . IFG (impaired fasting glucose)   . Hyperlipidemia    Guadelupe Sabin, OTR/L  [phone] 12/27/2017, 2:26 PM  [facility] [address] [place], [place], [address] Phone: [phone]   Fax:  [phone]  Name: MAXIM BEDEL MRN: [id number] Date of Birth: 04-26-1965

## 2017-12-31 ENCOUNTER — Encounter (HOSPITAL_COMMUNITY): Payer: Self-pay | Admitting: Specialist

## 2017-12-31 ENCOUNTER — Ambulatory Visit (HOSPITAL_COMMUNITY): Payer: BC Managed Care – PPO | Admitting: Specialist

## 2017-12-31 DIAGNOSIS — M25511 Pain in right shoulder: Secondary | ICD-10-CM

## 2017-12-31 DIAGNOSIS — R29898 Other symptoms and signs involving the musculoskeletal system: Secondary | ICD-10-CM

## 2017-12-31 NOTE — Therapy (Signed)
[place], [place], [address] Phone: [phone]   Fax:  [phone]  Occupational Therapy Treatment  Patient Details  Name: Devon Mora MRN: [id number] Date of Birth: 22-Aug-1965 Referring Provider: Dr. Elsie Saas   Encounter Date: 12/31/2017  OT End of Session - 12/31/17 1402    Visit Number  19    Number of Visits  24    Date for OT Re-Evaluation  01/24/18    Authorization Type  [employer] P[employer]    Authorization Time Period  No visit limit    OT Start Time  1302    OT Stop Time  1345    OT Time Calculation (min)  43 min    Activity Tolerance  Patient tolerated treatment well    Behavior During Therapy  [facility] for tasks assessed/performed       Past Medical History:  Diagnosis Date  . Allergic rhinitis   . CTS (carpal tunnel syndrome)   . Hyperlipidemia   . Hypertension   . IFG (impaired fasting glucose)   . Obesity     Past Surgical History:  Procedure Laterality Date  . right rotator cuff    . VASECTOMY      There were no vitals filed for this visit.  Subjective Assessment - 12/31/17 1401    Subjective   S:  I was really sore after we did the 3# weight last time.  I had to use a heat pack all weekend.     Currently in Pain?  Yes    Pain Score  2     Pain Location  Shoulder    Pain Orientation  Right    Pain Descriptors / Indicators  Aching    Pain Type  Acute pain    Pain Onset  In the past 7 days    Pain Frequency  Constant    Aggravating Factors   movement    Pain Relieving Factors  heat         [facility] OT Assessment - 12/31/17 0001      Assessment   Medical Diagnosis  s/p complex R RTC repair               OT Treatments/Exercises (OP) - 12/31/17 0001      Exercises   Exercises  Shoulder      Shoulder Exercises: Supine   Protraction  PROM;5 reps    Horizontal ABduction  PROM;5 reps    External Rotation  PROM;5 reps    Internal Rotation  PROM;5 reps    Flexion  PROM;5 reps    ABduction  PROM;5 reps      Shoulder Exercises: Sidelying   External Rotation  Theraband;10 reps    Theraband Level (Shoulder External Rotation)  Level 3 (Green)    Flexion  Theraband;10 reps    Theraband Level (Shoulder Flexion)  Level 3 (Green)    ABduction  Theraband;10 reps    Theraband Level (Shoulder ABduction)  Level 3 (Green)    Other Sidelying Exercises  protraction with green theraband 15 X       Shoulder Exercises: Standing   Other Standing Exercises  leaning forward for resistance compelted rear deltoid raise with twist into external rotation 10 times with minimal difficulty.       Shoulder Exercises: ROM/Strengthening   Other ROM/Strengthening Exercises  arnold press with 1# 10 times    Other ROM/Strengthening Exercises  bodycraft:  chest press, standing row, pec fly  10 #, 10 X      Shoulder Exercises: Body Blade   Flexion  --   10 reps   ABduction  --   10 reps   External Rotation  --   10 reps     Manual Therapy   Manual Therapy  Myofascial release    Manual therapy comments  manual therapy completed seperately from all other interventions    Myofascial Release  myofascial release and manual stretching to right upper arm, scapular, and shoudler region to decrease pain and fascial restrctions and improve pain free mobility in right shoulder region required for use of right arm with functional activities. deep myofascial release to anterior shoulder and latissimus dorsi region to decrease restrictions                OT Short Term Goals - 12/17/17 1103      OT SHORT TERM GOAL #1   Title  Pt will be provided with and educated on HEP to improve use of RUE as dominant during ADLs.     Time  6    Period  Weeks    Status  On-going      OT SHORT TERM GOAL #2   Title  Pt will improve RUE P/ROM to [facility] to improve ability to perform dressing tasks.     Time  6    Period  Weeks    Status  Achieved      OT SHORT TERM GOAL #3   Title  Pt  will improve RUE strength to 3-/5 to improve ability to reach items at waist to shoulder level.     Time  6    Period  Weeks    Status  Achieved      OT SHORT TERM GOAL #4   Title  Pt will decrease pain in RUE to no greater than 4/10 to improve ability to sleep in the bed.     Time  6    Period  Weeks    Status  Achieved        OT Long Term Goals - 12/17/17 1103      OT LONG TERM GOAL #1   Title  Pt will return to highest level of functioning using RUE as dominant during B/IADLs.     Time  12    Period  Weeks    Status  On-going      OT LONG TERM GOAL #2   Title  Pt will decrease pain in RUE to 2/10 or less to improve ability to perform work tasks using RUE as dominant.     Time  12    Period  Weeks    Status  On-going      OT LONG TERM GOAL #3   Title  Pt will decrease fascial restrictions in RUE to minimal amounts or less to improve mobility required for functional reaching tasks.     Time  12    Period  Weeks    Status  On-going      OT LONG TERM GOAL #4   Title  Pt will increase RUE A/ROM to WNL to improve ability to perform overhead reaching tasks.     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  Time  12    Period  Weeks    Status  On-going            Plan - 12/31/17 1404    Clinical Impression Statement  A:  Patient reports increased soreness in anterior shoulder and upper arm after increase to 3# weights with PRE at last session.  This session focused on strengthening with functional overhead reaching using therapeutic exercises other than PRE.  Patient demonstrated good form with bodyblade for strengthening and sustained activity tolerance of right shoulder.  Paitent able to increase to green theraband with sidelying strengthening.     Plan  P:  Increase reps with body blade, increase to 20# with  bodycraft chest press, pec fly, and row.  Add sustained overhead reach with weight while hanging shower curtain.        Patient will benefit from skilled therapeutic intervention in order to improve the following deficits and impairments:  Decreased activity tolerance, Decreased strength, Impaired flexibility, Decreased range of motion, Pain, Increased fascial restrictions, Impaired UE functional use  Visit Diagnosis: Acute pain of right shoulder  Other symptoms and signs involving the musculoskeletal system    Problem List Patient Active Problem List   Diagnosis Date Noted  . Grief 04/16/2015  . Mild intermittent asthma 05/09/2014  . Allergic rhinitis 02/25/2013  . Hypertension   . IFG (impaired fasting glucose)   . Hyperlipidemia     Vangie Bicker, [place], OTR/L [phone]  12/31/2017, 2:15 PM  [place] [address] [place], [place], [address] Phone: [phone]   Fax:  [phone]  Name: Devon Mora MRN: [id number] Date of Birth: August 03, 1965

## 2018-01-01 ENCOUNTER — Other Ambulatory Visit (INDEPENDENT_AMBULATORY_CARE_PROVIDER_SITE_OTHER): Payer: Self-pay | Admitting: Otolaryngology

## 2018-01-01 DIAGNOSIS — IMO0001 Reserved for inherently not codable concepts without codable children: Secondary | ICD-10-CM

## 2018-01-01 DIAGNOSIS — H9041 Sensorineural hearing loss, unilateral, right ear, with unrestricted hearing on the contralateral side: Secondary | ICD-10-CM

## 2018-01-02 ENCOUNTER — Ambulatory Visit (HOSPITAL_COMMUNITY): Payer: BC Managed Care – PPO | Admitting: Occupational Therapy

## 2018-01-02 ENCOUNTER — Encounter (HOSPITAL_COMMUNITY): Payer: Self-pay | Admitting: Occupational Therapy

## 2018-01-02 DIAGNOSIS — M25511 Pain in right shoulder: Secondary | ICD-10-CM

## 2018-01-02 DIAGNOSIS — R29898 Other symptoms and signs involving the musculoskeletal system: Secondary | ICD-10-CM

## 2018-01-02 NOTE — Therapy (Signed)
[place], [place], [address] Phone: [phone]   Fax:  [phone]  Occupational Therapy Treatment  Patient Details  Name: Devon Mora MRN: [id number] Date of Birth: [birth year] Referring Provider: Dr. Elsie Saas   Encounter Date: 01/02/2018  OT End of Session - 01/02/18 1357    Visit Number  20    Number of Visits  24    Date for OT Re-Evaluation  01/24/18    Authorization Type  [employer] P[employer]    Authorization Time Period  No visit limit    OT Start Time  1302    OT Stop Time  1348    OT Time Calculation (min)  46 min    Activity Tolerance  Patient tolerated treatment well    Behavior During Therapy  [facility] for tasks assessed/performed       Past Medical History:  Diagnosis Date  . Allergic rhinitis   . CTS (carpal tunnel syndrome)   . Hyperlipidemia   . Hypertension   . IFG (impaired fasting glucose)   . Obesity     Past Surgical History:  Procedure Laterality Date  . right rotator cuff    . VASECTOMY      There were no vitals filed for this visit.  Subjective Assessment - 01/02/18 1259    Subjective   S: I'm sore from last time.     Pain Score  0-No pain         [facility] OT Assessment - 01/02/18 0001      Assessment   Medical Diagnosis  s/p complex R RTC repair      Precautions   Precautions  Shoulder    Type of Shoulder Precautions  no lifting over 5#               OT Treatments/Exercises (OP) - 01/02/18 1304      Exercises   Exercises  Shoulder      Shoulder Exercises: Supine   Protraction  PROM;5 reps    Horizontal ABduction  PROM;5 reps    External Rotation  PROM;5 reps    Internal Rotation  PROM;5 reps    Flexion  PROM;5 reps    ABduction  PROM;5 reps      Shoulder Exercises: Sidelying   External Rotation  Theraband;10 reps    Theraband Level (Shoulder External Rotation)  Level 3 (Green)    Internal Rotation  Theraband;10 reps    Theraband Level  (Shoulder Internal Rotation)  Level 3 (Green)    Flexion  Theraband;10 reps    Theraband Level (Shoulder Flexion)  Level 3 (Green)    ABduction  Theraband;10 reps    Theraband Level (Shoulder ABduction)  Level 3 (Green)    Other Sidelying Exercises  protraction with green theraband 10X       Shoulder Exercises: Standing   Protraction  Strengthening;10 reps;Weights    Protraction Weight (lbs)  2    Horizontal ABduction  Strengthening;10 reps;Weights    Horizontal ABduction Weight (lbs)  2    External Rotation  Strengthening;10 reps;Weights    External Rotation Weight (lbs)  2    External Rotation Limitations  abducted    Internal Rotation  Strengthening;10 reps;Weights    Internal Rotation Weight (lbs)  2    Internal Rotation Limitations  abducted    Flexion  Strengthening;10 reps;Weights    Shoulder Flexion Weight (lbs)  2    ABduction  Strengthening;10 reps;Weights    Shoulder  ABduction Weight (lbs)  2      Shoulder Exercises: ROM/Strengthening   "W" Arms  10X, 2#    Other ROM/Strengthening Exercises  arnold press with 1# 10 times      Shoulder Exercises: Stretch   Other Shoulder Stretches  [place] stretch with PVC pipe 10X      Shoulder Exercises: Body Blade   Flexion  --   10 reps on last rep hold 10 seconds    ABduction  --   10 reps on last rep hold 10 seconds    External Rotation  --   10 reps on last rep hold 10 seconds      Manual Therapy   Manual Therapy  Myofascial release    Manual therapy comments  manual therapy completed seperately from all other interventions    Myofascial Release  myofascial release and manual stretching to right upper arm, scapular, and shoudler region to decrease pain and fascial restrctions and improve pain free mobility in right shoulder region required for use of right arm with functional activities. deep myofascial release to anterior shoulder and latissimus dorsi region to decrease restrictions                OT  Short Term Goals - 12/17/17 1103      OT SHORT TERM GOAL #1   Title  Pt will be provided with and educated on HEP to improve use of RUE as dominant during ADLs.     Time  6    Period  Weeks    Status  On-going      OT SHORT TERM GOAL #2   Title  Pt will improve RUE P/ROM to [facility] to improve ability to perform dressing tasks.     Time  6    Period  Weeks    Status  Achieved      OT SHORT TERM GOAL #3   Title  Pt will improve RUE strength to 3-/5 to improve ability to reach items at waist to shoulder level.     Time  6    Period  Weeks    Status  Achieved      OT SHORT TERM GOAL #4   Title  Pt will decrease pain in RUE to no greater than 4/10 to improve ability to sleep in the bed.     Time  6    Period  Weeks    Status  Achieved        OT Long Term Goals - 12/17/17 1103      OT LONG TERM GOAL #1   Title  Pt will return to highest level of functioning using RUE as dominant during B/IADLs.     Time  12    Period  Weeks    Status  On-going      OT LONG TERM GOAL #2   Title  Pt will decrease pain in RUE to 2/10 or less to improve ability to perform work tasks using RUE as dominant.     Time  12    Period  Weeks    Status  On-going      OT LONG TERM GOAL #3   Title  Pt will decrease fascial restrictions in RUE to minimal amounts or less to improve mobility required for functional reaching tasks.     Time  12    Period  Weeks    Status  On-going      OT LONG TERM GOAL #4  Title  Pt will increase RUE A/ROM to WNL to improve ability to perform overhead reaching tasks.     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     Time  12    Period  Weeks    Status  On-going            Plan - 01/02/18 1359    Clinical Impression Statement  A: Completed manual therapy to address fascial restrictions in anterior  deltoid, one small muscle knot palpated. Continued with green theraband exercises in sidelying to promote increased UE ROM. Progressed amount of weight utilized in shoulder exercises to improve strength. Introduced ball drop exercise to promote strength and coordination of the UE. Verbal cues provided for form and technique.       Plan  P: Manual therapy prn. Introduce sustained overhead reach with weight while hanging shower curtain. Add behind the back reaching activity to improve IR.        Patient will benefit from skilled therapeutic intervention in order to improve the following deficits and impairments:  Decreased strength, Impaired flexibility, Decreased range of motion, Pain, Increased fascial restrictions, Impaired UE functional use, Decreased activity tolerance  Visit Diagnosis: Acute pain of right shoulder  Other symptoms and signs involving the musculoskeletal system    Problem List Patient Active Problem List   Diagnosis Date Noted  . Grief 04/16/2015  . Mild intermittent asthma 05/09/2014  . Allergic rhinitis 02/25/2013  . Hypertension   . IFG (impaired fasting glucose)   . Hyperlipidemia     Toney Rakes, OT Student  01/02/2018, 2:13 PM  [place], [place], [address] Phone: [phone]   Fax:  [phone]  Name: Devon Mora MRN: [id number] Date of Birth: 1965/05/17

## 2018-01-02 NOTE — Addendum Note (Signed)
Addended by: Guadelupe Sabin A on: 01/02/2018 02:25 PM   Modules accepted: Orders

## 2018-01-07 ENCOUNTER — Ambulatory Visit (HOSPITAL_COMMUNITY): Payer: BC Managed Care – PPO | Admitting: Specialist

## 2018-01-07 ENCOUNTER — Encounter (HOSPITAL_COMMUNITY): Payer: Self-pay | Admitting: Specialist

## 2018-01-07 DIAGNOSIS — R29898 Other symptoms and signs involving the musculoskeletal system: Secondary | ICD-10-CM

## 2018-01-07 DIAGNOSIS — M25511 Pain in right shoulder: Secondary | ICD-10-CM

## 2018-01-07 NOTE — Therapy (Signed)
[place], [place], [address] Phone: [phone]   Fax:  [phone]  Occupational Therapy Treatment  Patient Details  Name: Devon Mora MRN: [id number] Date of Birth: 1966-01-21 Referring Provider: Dr. Elsie Saas   Encounter Date: 01/07/2018  OT End of Session - 01/07/18 1503    Visit Number  21    Number of Visits  24    Date for OT Re-Evaluation  01/24/18    Authorization Type  [employer] P[employer]    Authorization Time Period  No visit limit    OT Start Time  1350    OT Stop Time  1430    OT Time Calculation (min)  40 min       Past Medical History:  Diagnosis Date  . Allergic rhinitis   . CTS (carpal tunnel syndrome)   . Hyperlipidemia   . Hypertension   . IFG (impaired fasting glucose)   . Obesity     Past Surgical History:  Procedure Laterality Date  . right rotator cuff    . VASECTOMY      There were no vitals filed for this visit.  Subjective Assessment - 01/07/18 1501    Subjective   S:  I went back to work today for 1/2 day.      Currently in Pain?  Yes    Pain Score  2     Pain Location  Shoulder    Pain Orientation  Right    Pain Descriptors / Indicators  Aching    Pain Onset  In the past 7 days    Pain Frequency  Constant                   OT Treatments/Exercises (OP) - 01/07/18 0001      Exercises   Exercises  Shoulder      Shoulder Exercises: Standing   Protraction  Strengthening;12 reps    Protraction Weight (lbs)  2    Horizontal ABduction  Strengthening;12 reps    Horizontal ABduction Weight (lbs)  2    External Rotation  Strengthening;12 reps    External Rotation Weight (lbs)  2    External Rotation Limitations  abducted    Internal Rotation  Strengthening;12 reps    Internal Rotation Weight (lbs)  2    Internal Rotation Limitations  abducted    Flexion  Strengthening;12 reps    Shoulder Flexion Weight (lbs)  2    ABduction   Strengthening;12 reps    Shoulder ABduction Weight (lbs)  2    Other Standing Exercises  patient passed green weighted ball around his body focusing on improved internal rotation 10 times with therapist facilittating improved internal rotation     Other Standing Exercises  patient unhooked shower curtain liner and then rehooked, focusing on strength needed for sustained overhead activity, which is needed for return to full duties at work.      Shoulder Exercises: ROM/Strengthening   UBE (Upper Arm Bike)  level 2' 3 minutes forward and reverse at 5.0 speed     "W" Arms  15X 2#    X to V Arms  15X 2#    Other ROM/Strengthening Exercises  arnold press 2# 15 times    Other ROM/Strengthening Exercises  with 2# weight, wrote alphabet on door       Shoulder Exercises: Body Blade   Flexion  --   10 times   ABduction  --  10 times   External Rotation  --   10 times                               OT Short Term Goals - 12/17/17 1103      OT SHORT TERM GOAL #1   Title  Pt will be provided with and educated on HEP to improve use of RUE as dominant during ADLs.     Time  6    Period  Weeks    Status  On-going      OT SHORT TERM GOAL #2   Title  Pt will improve RUE P/ROM to [facility] to improve ability to perform dressing tasks.     Time  6    Period  Weeks    Status  Achieved      OT SHORT TERM GOAL #3   Title  Pt will improve RUE strength to 3-/5 to improve ability to reach items at waist to shoulder level.     Time  6    Period  Weeks    Status  Achieved      OT SHORT TERM GOAL #4   Title  Pt will decrease pain in RUE to no greater than 4/10 to improve ability to sleep in the bed.     Time  6    Period  Weeks    Status  Achieved        OT Long Term Goals - 12/17/17 1103      OT LONG TERM GOAL #1   Title  Pt will return to highest level of functioning using RUE as dominant during B/IADLs.     Time  12    Period  Weeks    Status  On-going      OT LONG TERM GOAL #2    Title  Pt will decrease pain in RUE to 2/10 or less to improve ability to perform work tasks using RUE as dominant.     Time  12    Period  Weeks    Status  On-going      OT LONG TERM GOAL #3   Title  Pt will decrease fascial restrictions in RUE to minimal amounts or less to improve mobility required for functional reaching tasks.     Time  12    Period  Weeks    Status  On-going      OT LONG TERM GOAL #4   Title  Pt will increase RUE A/ROM to WNL to improve ability to perform overhead reaching tasks.     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     Time  12    Period  Weeks    Status  On-going          A:  Patient became fatigued with body blade and overhead sustained activity tolerance and strengthening exercises.  2# resistance is just right challenge for patient.   Plan - 01/07/18 1535    OT Treatment/Interventions  Self-care/ADL training;Therapeutic exercise;Ultrasound;Manual Therapy;Therapeutic activities;Cryotherapy;Electrical Stimulation;Moist Heat;Passive range of motion;Patient/family education    Plan  P:  complete shower curtain activity for sustained overhead reach with 1# cuff weight.  add sleeper  stretch for external and internal rotation improvement        Patient will benefit from skilled therapeutic intervention in order to improve the following deficits and impairments:     Visit Diagnosis: Acute pain of right shoulder  Other symptoms and signs involving the musculoskeletal system    Problem List Patient Active Problem List   Diagnosis Date Noted  . Grief 04/16/2015  . Mild intermittent asthma 05/09/2014  . Allergic rhinitis 02/25/2013  . Hypertension   . IFG (impaired fasting glucose)   . Hyperlipidemia     Vangie Bicker, [place], OTR/L [id number]  01/07/2018, 3:52 PM  [place] [address] [place], [place], [address] Phone: [phone]   Fax:  [phone]  Name: Devon Mora MRN: [id number] Date of Birth: 09/01/1965

## 2018-01-08 ENCOUNTER — Ambulatory Visit (HOSPITAL_COMMUNITY)
Admission: RE | Admit: 2018-01-08 | Discharge: 2018-01-08 | Disposition: A | Discharge status: Home / Self Care | Payer: BC Managed Care – PPO | Source: Ambulatory Visit | Attending: Otolaryngology | Admitting: Otolaryngology

## 2018-01-08 DIAGNOSIS — R9082 White matter disease, unspecified: Secondary | ICD-10-CM | POA: Diagnosis not present

## 2018-01-08 DIAGNOSIS — H9041 Sensorineural hearing loss, unilateral, right ear, with unrestricted hearing on the contralateral side: Secondary | ICD-10-CM | POA: Diagnosis not present

## 2018-01-08 DIAGNOSIS — IMO0001 Reserved for inherently not codable concepts without codable children: Secondary | ICD-10-CM

## 2018-01-08 LAB — POCT I-STAT CREATININE: Creatinine, Ser: 1.1 mg/dL (ref 0.61–1.24)

## 2018-01-08 MED ORDER — GADOBENATE DIMEGLUMINE 529 MG/ML IV SOLN
20.0000 mL | Freq: Once | INTRAVENOUS | Status: AC | PRN
  Administered 2018-01-08: 20 mL via INTRAVENOUS

## 2018-01-09 ENCOUNTER — Ambulatory Visit (HOSPITAL_COMMUNITY): Payer: BC Managed Care – PPO

## 2018-01-09 ENCOUNTER — Encounter (HOSPITAL_COMMUNITY): Payer: Self-pay

## 2018-01-09 DIAGNOSIS — R29898 Other symptoms and signs involving the musculoskeletal system: Secondary | ICD-10-CM

## 2018-01-09 DIAGNOSIS — M25511 Pain in right shoulder: Secondary | ICD-10-CM

## 2018-01-09 NOTE — Therapy (Addendum)
[place], [place], [address] Phone: [phone]   Fax:  [phone]  Occupational Therapy Treatment and Reassessment/Discharge  Patient Details  Name: Devon Mora MRN: [id number] Date of Birth: July 10, 1965 Referring Provider: Dr. Elsie Saas   Encounter Date: 01/09/2018  OT End of Session - 01/09/18 1644    Visit Number  22    Number of Visits  24    Date for OT Re-Evaluation  --    Authorization Type  [employer]    Authorization Time Period  No visit limit    OT Start Time  1349    OT Stop Time  1441    OT Time Calculation (min)  52 min    Activity Tolerance  Patient tolerated treatment well    Behavior During Therapy  [facility] for tasks assessed/performed       Past Medical History:  Diagnosis Date  . Allergic rhinitis   . CTS (carpal tunnel syndrome)   . Hyperlipidemia   . Hypertension   . IFG (impaired fasting glucose)   . Obesity     Past Surgical History:  Procedure Laterality Date  . right rotator cuff    . VASECTOMY      There were no vitals filed for this visit.  Subjective Assessment - 01/09/18 1647    Subjective   S: I'm feeling a lot stronger now.     Special Tests  FOTO: 63/100     Currently in Pain?  No/denies    Pain Score  0-No pain             [facility] OT Assessment - 01/09/18 1343      Assessment   Medical Diagnosis  s/p complex R RTC repair      Precautions   Precautions  Shoulder    Type of Shoulder Precautions  no lifting over 5#      Observation/Other Assessments   Focus on Therapeutic Outcomes (FOTO)   63/100      ROM / Strength   AROM / PROM / Strength  AROM;Strength      Palpation   Palpation comment  min fascial restrictions in right upper arm, trapezius, and scapularis regions.      AROM   Overall AROM Comments  assessed in standing, external and internal rotation with shoulder adducted    Right Shoulder Flexion  173 Degrees   previous 155    Right Shoulder ABduction  165 Degrees   previous 145   Right Shoulder Internal Rotation  90 Degrees   previous 80   Right Shoulder External Rotation  87 Degrees   previous 78     Strength   Strength Assessment Site  Shoulder    Right/Left Shoulder  Right    Right Shoulder Flexion  5/5   previous 4/5   Right Shoulder ABduction  4+/5   previous 4/5   Right Shoulder Internal Rotation  4/5   previous 4-/5   Right Shoulder External Rotation  4/5   previous 4-/5              OT Treatments/Exercises (OP) - 01/09/18 1343      Exercises   Exercises  Shoulder      Shoulder Exercises: Sidelying   External Rotation  Strengthening;15 reps;Weights    External Rotation Weight (lbs)  1    Internal Rotation  Strengthening;15 reps;Weights    Internal Rotation Weight (lbs)  1    Flexion  Strengthening;15 reps;Weights    Flexion Weight (lbs)  1    ABduction  Strengthening;15 reps;Weights    ABduction Weight (lbs)  1      Shoulder Exercises: Standing   Protraction  Strengthening;15 reps;Weights    Protraction Weight (lbs)  2            External Rotation  Strengthening;15 reps;Weights    External Rotation Weight (lbs)  2    External Rotation Limitations  abducted    Internal Rotation  Strengthening;15 reps;Weights    Internal Rotation Weight (lbs)  2    Internal Rotation Limitations  abducted    Flexion  Strengthening;15 reps;Weights    Shoulder Flexion Weight (lbs)  2    ABduction  Strengthening;15 reps;Weights    Shoulder ABduction Weight (lbs)  2    Other Standing Exercises  patient passed green weighted ball around his body focusing on improved internal rotation 10X    Other Standing Exercises  patient unhooked shower curtain liner and then rehooked, taking 1.3 minutes to finish task. Focus on strength needed for sustained overhead activity, which is needed for return to full duties at work..      Shoulder Exercises: ROM/Strengthening   UBE (Upper Arm Bike)  level 3;  3' forward and 3' reverse      "W" Arms  15X 2#    X to V Arms  15X 2#    Other ROM/Strengthening Exercises  Wrote alphabet on door with 2# weight      Shoulder Exercises: Body Blade   Flexion  --   10 reps on last rep hold 10 seconds    ABduction  --   10 reps on last rep hold 10 seconds    External Rotation  --   10 reps on last rep hold 10 seconds               OT Short Term Goals - 01/09/18 1641      OT SHORT TERM GOAL #1   Title  Pt will be provided with and educated on HEP to improve use of RUE as dominant during ADLs.     Time  6    Period  Weeks    Status  Achieved      OT SHORT TERM GOAL #2   Title  Pt will improve RUE P/ROM to [facility] to improve ability to perform dressing tasks.     Time  6    Period  Weeks    Status  Achieved      OT SHORT TERM GOAL #3   Title  Pt will improve RUE strength to 3-/5 to improve ability to reach items at waist to shoulder level.     Time  6    Period  Weeks    Status  Achieved      OT SHORT TERM GOAL #4   Title  Pt will decrease pain in RUE to no greater than 4/10 to improve ability to sleep in the bed.     Time  6    Period  Weeks    Status  Achieved        OT Long Term Goals - 01/09/18 1439      OT LONG TERM GOAL #1   Title  Pt will return to highest level of functioning using RUE as dominant during B/IADLs.     Time  12    Period  Weeks    Status  Achieved      OT  LONG TERM GOAL #2   Title  Pt will decrease pain in RUE to 2/10 or less to improve ability to perform work tasks using RUE as dominant.     Time  12    Period  Weeks    Status  Achieved      OT LONG TERM GOAL #3   Title  Pt will decrease fascial restrictions in RUE to minimal amounts or less to improve mobility required for functional reaching tasks.     Time  12    Period  Weeks    Status  Achieved      OT LONG TERM GOAL #4   Title  Pt will increase RUE A/ROM to WNL to improve ability to perform overhead reaching tasks.     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     Time  12    Period  Weeks    Status  Partially Met            Plan - 01/09/18 1634    Clinical Impression Statement  A: Reassessment completed this date. Pt has notably improved strength, flexibility, and activity tolerance needed to return to B/IADLs. He continues to have slight deficits related to his strength and internal rotation. However, pt reports that he feels almost back to his baseline and he is now able to complete all of his functional daily activities. Four out of four short term goals have been met and four out of five long term goals were met. Present HEP is appropriate and does not need updating. Pt reports the ability to complete HEP independently at home. Pt agrees with discharge plan.     Plan  P: Discharge from OT services with HEP.        Patient will benefit from skilled therapeutic intervention in order to improve the following deficits and impairments:  Decreased strength, Impaired flexibility, Decreased range of motion, Pain, Increased fascial restrictions, Impaired UE functional use, Decreased activity tolerance  Visit Diagnosis: Acute pain of right shoulder  Other symptoms and signs involving the musculoskeletal system    Problem List Patient Active Problem List   Diagnosis Date Noted  . Grief 04/16/2015  . Mild intermittent asthma 05/09/2014  . Allergic rhinitis 02/25/2013  . Hypertension   . IFG (impaired fasting glucose)   . Hyperlipidemia     Toney Rakes, OT Student 01/09/2018, 5:57 PM  [place], [place], [address] Phone: [phone]   Fax:  [phone]  Name: Devon Mora MRN: [id number] Date of Birth: 1965/07/11    OCCUPATIONAL THERAPY DISCHARGE SUMMARY  Visits from Start  of Care: 22  Current functional level related to goals / functional outcomes: See above   Remaining deficits: See above   Education / Equipment: See above Plan: Patient agrees to discharge.  Patient goals were met. Patient is being discharged due to meeting the stated rehab goals.  ?????         Ailene Ravel, OTR/L,CBIS  [phone]

## 2018-01-30 ENCOUNTER — Telehealth (INDEPENDENT_AMBULATORY_CARE_PROVIDER_SITE_OTHER): Payer: Self-pay | Admitting: *Deleted

## 2018-01-30 NOTE — Telephone Encounter (Signed)
TCS sch'd 05/09/18, patient aware

## 2018-01-30 NOTE — Telephone Encounter (Signed)
Patient's wife called, Colletta [place]. She states that her husband had been scheduled for a TCS and it was canceled and that he would be called back with another appointment. He has not heard anything yet. His PCP is asking him to get this done. Patient would like it done before the end of the year as he has met his deductible for the year. His contact number is [phone]

## 2018-02-04 ENCOUNTER — Other Ambulatory Visit: Payer: Self-pay | Admitting: *Deleted

## 2018-02-04 ENCOUNTER — Telehealth: Payer: Self-pay | Admitting: Family Medicine

## 2018-02-04 DIAGNOSIS — Z79899 Other long term (current) drug therapy: Secondary | ICD-10-CM

## 2018-02-04 DIAGNOSIS — Z125 Encounter for screening for malignant neoplasm of prostate: Secondary | ICD-10-CM

## 2018-02-04 DIAGNOSIS — E785 Hyperlipidemia, unspecified: Secondary | ICD-10-CM

## 2018-02-04 DIAGNOSIS — I10 Essential (primary) hypertension: Secondary | ICD-10-CM

## 2018-02-04 NOTE — Telephone Encounter (Signed)
Left message to return call to let pt know orders are ready.

## 2018-02-04 NOTE — Telephone Encounter (Signed)
Pt.notified

## 2018-02-04 NOTE — Telephone Encounter (Signed)
Rep same plus cbc 

## 2018-02-04 NOTE — Telephone Encounter (Signed)
Pt had Bmet,lipid,hepatic fx,psa done 02/26/2017. Same? Please advise.

## 2018-02-04 NOTE — Telephone Encounter (Signed)
Patient has physical schedule 10/23 and needing to get his labs done.

## 2018-02-07 LAB — CBC WITH DIFFERENTIAL/PLATELET
BASOS: 1 %
Basophils Absolute: 0 10*3/uL (ref 0.0–0.2)
EOS (ABSOLUTE): 0.3 10*3/uL (ref 0.0–0.4)
EOS: 4 %
HEMATOCRIT: 41.1 % (ref 37.5–51.0)
Hemoglobin: 14.9 g/dL (ref 13.0–17.7)
Immature Grans (Abs): 0 10*3/uL (ref 0.0–0.1)
Immature Granulocytes: 1 %
LYMPHS ABS: 2.7 10*3/uL (ref 0.7–3.1)
Lymphs: 41 %
MCH: 32 pg (ref 26.6–33.0)
MCHC: 36.3 g/dL — AB (ref 31.5–35.7)
MCV: 88 fL (ref 79–97)
MONOS ABS: 0.8 10*3/uL (ref 0.1–0.9)
Monocytes: 13 %
Neutrophils Absolute: 2.7 10*3/uL (ref 1.4–7.0)
Neutrophils: 40 %
PLATELETS: 248 10*3/uL (ref 150–450)
RBC: 4.65 x10E6/uL (ref 4.14–5.80)
RDW: 12.6 % (ref 12.3–15.4)
WBC: 6.5 10*3/uL (ref 3.4–10.8)

## 2018-02-07 LAB — BASIC METABOLIC PANEL
BUN / CREAT RATIO: 19 (ref 9–20)
BUN: 18 mg/dL (ref 6–24)
CO2: 23 mmol/L (ref 20–29)
CREATININE: 0.97 mg/dL (ref 0.76–1.27)
Calcium: 9.5 mg/dL (ref 8.7–10.2)
Chloride: 101 mmol/L (ref 96–106)
GFR calc Af Amer: 102 mL/min/{1.73_m2} (ref >59)
GFR, EST NON AFRICAN AMERICAN: 88 mL/min/{1.73_m2} (ref >59)
Glucose: 101 mg/dL — ABNORMAL HIGH (ref 65–99)
POTASSIUM: 4.6 mmol/L (ref 3.5–5.2)
Sodium: 140 mmol/L (ref 134–144)

## 2018-02-07 LAB — HEPATIC FUNCTION PANEL
ALT: 35 IU/L (ref 0–44)
AST: 27 IU/L (ref 0–40)
Albumin: 4.7 g/dL (ref 3.5–5.5)
Alkaline Phosphatase: 44 IU/L (ref 39–117)
Bilirubin Total: 1.1 mg/dL (ref 0.0–1.2)
Bilirubin, Direct: 0.2 mg/dL (ref 0.00–0.40)
Total Protein: 7.1 g/dL (ref 6.0–8.5)

## 2018-02-07 LAB — PSA: Prostate Specific Ag, Serum: 0.2 ng/mL (ref 0.0–4.0)

## 2018-02-07 LAB — LIPID PANEL
CHOLESTEROL TOTAL: 239 mg/dL — AB (ref 100–199)
Chol/HDL Ratio: 6.8 ratio — ABNORMAL HIGH (ref 0.0–5.0)
HDL: 35 mg/dL — ABNORMAL LOW (ref >39)
LDL Calculated: 138 mg/dL — ABNORMAL HIGH (ref 0–99)
TRIGLYCERIDES: 331 mg/dL — AB (ref 0–149)
VLDL Cholesterol Cal: 66 mg/dL — ABNORMAL HIGH (ref 5–40)

## 2018-02-13 ENCOUNTER — Encounter: Payer: Self-pay | Admitting: Family Medicine

## 2018-02-13 ENCOUNTER — Ambulatory Visit: Payer: BC Managed Care – PPO | Admitting: Family Medicine

## 2018-02-13 VITALS — BP 138/90 | Ht 68.25 in | Wt 262.6 lb

## 2018-02-13 DIAGNOSIS — Z1211 Encounter for screening for malignant neoplasm of colon: Secondary | ICD-10-CM | POA: Diagnosis not present

## 2018-02-13 DIAGNOSIS — I10 Essential (primary) hypertension: Secondary | ICD-10-CM

## 2018-02-13 DIAGNOSIS — Z0000 Encounter for general adult medical examination without abnormal findings: Secondary | ICD-10-CM

## 2018-02-13 MED ORDER — MONTELUKAST SODIUM 10 MG PO TABS: 10.0000 mg | ORAL_TABLET | Freq: Every day | ORAL | 1 refills | Status: DC

## 2018-02-13 MED ORDER — ENALAPRIL MALEATE 10 MG PO TABS: ORAL_TABLET | ORAL | 1 refills | Status: DC

## 2018-02-13 NOTE — Progress Notes (Signed)
Subjective:    Patient ID: Devon Mora, male    DOB: 06/18/1965, 52 y.o.   MRN: [id number]  HPI The patient comes in today for a wellness visit.    A review of their health history was completed.  A review of medications was also completed.  Any needed refills; bp meds and singulair  Eating habits: health conscious  Falls/  MVA accidents in past few months: none  Regular exercise: yes walking and weight machine  Specialist pt sees on regular basis: Dr. Noemi Chapel for shoulder  Preventative health issues were discussed.   Additional concerns: none  Declines flu vaccine.   Blood pressure medicine and blood pressure levels reviewed today with patient. Compliant with blood pressure medicine. States does not miss a dose. No obvious side effects. Blood pressure generally good when checked elsewhere. Watching salt intake. Results for orders placed or performed in visit on 02/04/18  CBC with Differential/Platelet  Result Value Ref Range   WBC 6.5 3.4 - 10.8 x10E3/uL   RBC 4.65 4.14 - 5.80 x10E6/uL   Hemoglobin 14.9 13.0 - 17.7 g/dL   Hematocrit 41.1 37.5 - 51.0 %   MCV 88 79 - 97 fL   MCH 32.0 26.6 - 33.0 pg   MCHC 36.3 (H) 31.5 - 35.7 g/dL   RDW 12.6 12.3 - 15.4 %   Platelets 248 150 - 450 x10E3/uL   Neutrophils 40 Not Estab. %   Lymphs 41 Not Estab. %   Monocytes 13 Not Estab. %   Eos 4 Not Estab. %   Basos 1 Not Estab. %   Neutrophils Absolute 2.7 1.4 - 7.0 x10E3/uL   Lymphocytes Absolute 2.7 0.7 - 3.1 x10E3/uL   Monocytes Absolute 0.8 0.1 - 0.9 x10E3/uL   EOS (ABSOLUTE) 0.3 0.0 - 0.4 x10E3/uL   Basophils Absolute 0.0 0.0 - 0.2 x10E3/uL   Immature Granulocytes 1 Not Estab. %   Immature Grans (Abs) 0.0 0.0 - 0.1 x10E3/uL  PSA  Result Value Ref Range   Prostate Specific Ag, Serum 0.2 0.0 - 4.0 ng/mL  Basic metabolic panel  Result Value Ref Range   Glucose 101 (H) 65 - 99 mg/dL   BUN 18 6 - 24 mg/dL   Creatinine, Ser 0.97 0.76 - 1.27 mg/dL   GFR calc non Af Amer  88 >59 mL/min/1.73   GFR calc Af Amer 102 >59 mL/min/1.73   BUN/Creatinine Ratio 19 9 - 20   Sodium 140 134 - 144 mmol/L   Potassium 4.6 3.5 - 5.2 mmol/L   Chloride 101 96 - 106 mmol/L   CO2 23 20 - 29 mmol/L   Calcium 9.5 8.7 - 10.2 mg/dL  Hepatic function panel  Result Value Ref Range   Total Protein 7.1 6.0 - 8.5 g/dL   Albumin 4.7 3.5 - 5.5 g/dL   Bilirubin Total 1.1 0.0 - 1.2 mg/dL   Bilirubin, Direct 0.20 0.00 - 0.40 mg/dL   Alkaline Phosphatase 44 39 - 117 IU/L   AST 27 0 - 40 IU/L   ALT 35 0 - 44 IU/L  Lipid panel  Result Value Ref Range   Cholesterol, Total 239 (H) 100 - 199 mg/dL   Triglycerides 331 (H) 0 - 149 mg/dL   HDL 35 (L) >39 mg/dL   VLDL Cholesterol Cal 66 (H) 5 - 40 mg/dL   LDL Calculated 138 (H) 0 - 99 mg/dL   Chol/HDL Ratio 6.8 (H) 0.0 - 5.0 ratio   BP s usually good 120 ish  ove upper 70s  Pt was set up for colonoscopy, then never heard back from them   gfa had a astroke long time ago  No one with heart attacks      Review of Systems  Constitutional: Negative for activity change, appetite change and fever.  HENT: Negative for congestion and rhinorrhea.   Eyes: Negative for discharge.  Respiratory: Negative for cough and wheezing.   Cardiovascular: Negative for chest pain.  Gastrointestinal: Negative for abdominal pain, blood in stool and vomiting.  Genitourinary: Negative for difficulty urinating and frequency.  Musculoskeletal: Negative for neck pain.  Skin: Negative for rash.  Allergic/Immunologic: Negative for environmental allergies and food allergies.  Neurological: Negative for weakness and headaches.  Psychiatric/Behavioral: Negative for agitation.  All other systems reviewed and are negative.  .    Objective:   Physical Exam  Constitutional: He appears well-developed and well-nourished.  HENT:  Head: Normocephalic and atraumatic.  Right Ear: External ear normal.  Left Ear: External ear normal.  Nose: Nose normal.    Mouth/Throat: Oropharynx is clear and moist.  Eyes: Pupils are equal, round, and reactive to light. EOM are normal.  Neck: Normal range of motion. Neck supple. No thyromegaly present.  Cardiovascular: Normal rate, regular rhythm and normal heart sounds.  No murmur heard. Pulmonary/Chest: Effort normal and breath sounds normal. No respiratory distress. He has no wheezes.  Abdominal: Soft. Bowel sounds are normal. He exhibits no distension and no mass. There is no tenderness.  Genitourinary: Penis normal.  Genitourinary Comments: Prostate exam within normal limits  Musculoskeletal: Normal range of motion. He exhibits no edema.  Lymphadenopathy:    He has no cervical adenopathy.  Neurological: He is alert. He exhibits normal muscle tone.  Skin: Skin is warm and dry. No erythema.  Psychiatric: He has a normal mood and affect. His behavior is normal. Judgment normal.          Assessment & Plan:  Impression wellness exam.  Diet discussed.  Exercise discussed.  Blood work discussed.  Vaccines discussed and administered were appropriate.  A full year ago we attempted to persuade patient to get colonoscopy.  Due to delay both secondary to GI doctor and entered the patient's own priorities, this has not been done yet.  We will once again attempt to make referral.  This time towards [facility] in hopes of click or evaluation.  2.  Hypertension.  Good control discussed to maintain same meds medications refilled.  3.  Mild intermittent asthma clinically stable  4.  Hyperlipidemia discussed  Follow-up in 6 months diet exercise discussed

## 2018-02-18 ENCOUNTER — Encounter: Payer: Self-pay | Admitting: Family Medicine

## 2018-02-19 ENCOUNTER — Telehealth (INDEPENDENT_AMBULATORY_CARE_PROVIDER_SITE_OTHER): Payer: Self-pay | Admitting: *Deleted

## 2018-02-19 ENCOUNTER — Encounter (INDEPENDENT_AMBULATORY_CARE_PROVIDER_SITE_OTHER): Payer: Self-pay | Admitting: *Deleted

## 2018-02-19 MED ORDER — SUPREP BOWEL PREP KIT 17.5-3.13-1.6 GM/177ML PO SOLN: 1.0000 | Freq: Once | ORAL | 0 refills | Status: AC

## 2018-02-19 NOTE — Telephone Encounter (Signed)
Patient needs suprep 

## 2018-02-19 NOTE — Telephone Encounter (Signed)
Referring MD/PCP: steve luking   Procedure: tcs  Reason/Indication:  screening  Has patient had this procedure before?  no  If so, when, by whom and where?    Is there a family history of colon cancer?  no  Who?  What age when diagnosed?    Is patient diabetic?   no      Does patient have prosthetic heart valve or mechanical valve?  no  Do you have a pacemaker?  no  Has patient ever had endocarditis? no  Has patient had joint replacement within last 12 months?  no  Is patient constipated or do they take laxatives? no  Does patient have a history of alcohol/drug use?  no  Is patient on blood thinner such as Coumadin, Plavix and/or Aspirin? no  Medications: singulair 10 mg daily, ramipril 10 mg daily, tylenol prn  Allergies: nkda  Medication Adjustment per Dr Lindi Adie, NP:   Procedure date & time: 03/18/18 at 830

## 2018-02-19 NOTE — Telephone Encounter (Signed)
agree

## 2018-02-22 ENCOUNTER — Other Ambulatory Visit (INDEPENDENT_AMBULATORY_CARE_PROVIDER_SITE_OTHER): Payer: Self-pay | Admitting: *Deleted

## 2018-02-22 DIAGNOSIS — Z1211 Encounter for screening for malignant neoplasm of colon: Secondary | ICD-10-CM | POA: Insufficient documentation

## 2018-03-07 ENCOUNTER — Encounter (HOSPITAL_COMMUNITY): Payer: Self-pay

## 2018-03-18 ENCOUNTER — Other Ambulatory Visit: Payer: Self-pay

## 2018-03-18 ENCOUNTER — Encounter (HOSPITAL_COMMUNITY)
Admission: RE | Disposition: A | Discharge status: Home / Self Care | Payer: Self-pay | Source: Ambulatory Visit | Attending: Internal Medicine

## 2018-03-18 ENCOUNTER — Encounter (HOSPITAL_COMMUNITY): Payer: Self-pay | Admitting: *Deleted

## 2018-03-18 ENCOUNTER — Ambulatory Visit (HOSPITAL_COMMUNITY)
Admission: RE | Admit: 2018-03-18 | Discharge: 2018-03-18 | Disposition: A | Discharge status: Home / Self Care | Payer: BC Managed Care – PPO | Source: Ambulatory Visit | Attending: Internal Medicine | Admitting: Internal Medicine

## 2018-03-18 DIAGNOSIS — Z1211 Encounter for screening for malignant neoplasm of colon: Secondary | ICD-10-CM

## 2018-03-18 DIAGNOSIS — K644 Residual hemorrhoidal skin tags: Secondary | ICD-10-CM | POA: Insufficient documentation

## 2018-03-18 DIAGNOSIS — K5730 Diverticulosis of large intestine without perforation or abscess without bleeding: Secondary | ICD-10-CM | POA: Insufficient documentation

## 2018-03-18 DIAGNOSIS — I10 Essential (primary) hypertension: Secondary | ICD-10-CM | POA: Diagnosis not present

## 2018-03-18 DIAGNOSIS — Z79899 Other long term (current) drug therapy: Secondary | ICD-10-CM | POA: Insufficient documentation

## 2018-03-18 DIAGNOSIS — R7301 Impaired fasting glucose: Secondary | ICD-10-CM | POA: Diagnosis not present

## 2018-03-18 DIAGNOSIS — E785 Hyperlipidemia, unspecified: Secondary | ICD-10-CM | POA: Diagnosis not present

## 2018-03-18 DIAGNOSIS — D123 Benign neoplasm of transverse colon: Secondary | ICD-10-CM

## 2018-03-18 HISTORY — PX: POLYPECTOMY: SHX5525

## 2018-03-18 HISTORY — PX: COLONOSCOPY: SHX5424

## 2018-03-18 SURGERY — COLONOSCOPY
Anesthesia: Moderate Sedation

## 2018-03-18 MED ORDER — ONDANSETRON HCL 4 MG/2ML IJ SOLN
INTRAMUSCULAR | Status: AC
  Filled 2018-03-18: qty 2

## 2018-03-18 MED ORDER — MIDAZOLAM HCL 5 MG/5ML IJ SOLN
INTRAMUSCULAR | Status: AC
  Filled 2018-03-18: qty 10

## 2018-03-18 MED ORDER — MEPERIDINE HCL 50 MG/ML IJ SOLN
INTRAMUSCULAR | Status: AC
  Filled 2018-03-18: qty 1

## 2018-03-18 MED ORDER — MEPERIDINE HCL 50 MG/ML IJ SOLN
INTRAMUSCULAR | Status: DC | PRN
  Administered 2018-03-18 (×3): 25 mg via INTRAVENOUS

## 2018-03-18 MED ORDER — MIDAZOLAM HCL 5 MG/5ML IJ SOLN
INTRAMUSCULAR | Status: DC | PRN
  Administered 2018-03-18 (×3): 2 mg via INTRAVENOUS

## 2018-03-18 MED ORDER — ONDANSETRON HCL 4 MG/2ML IJ SOLN
4.0000 mg | Freq: Once | INTRAMUSCULAR | Status: AC
  Administered 2018-03-18: 4 mg via INTRAVENOUS

## 2018-03-18 MED ORDER — SODIUM CHLORIDE 0.9 % IV SOLN
INTRAVENOUS | Status: DC
  Administered 2018-03-18: 08:00:00 via INTRAVENOUS

## 2018-03-18 MED ORDER — STERILE WATER FOR IRRIGATION IR SOLN
Status: DC | PRN
  Administered 2018-03-18: 08:00:00

## 2018-03-18 NOTE — Discharge Instructions (Signed)
No aspirin or NSAIDs for 24 hours.   Resume usual medications as before. High-fiber diet. No driving for 24 hours. Physician will call with biopsy results.       Colonoscopy, Adult, Care After This sheet gives you information about how to care for yourself after your procedure. Your doctor may also give you more specific instructions. If you have problems or questions, call your doctor. Follow these instructions at home: General instructions   For the first 24 hours after the procedure: ? Do not drive or use machinery. ? Do not sign important documents. ? Do not drink alcohol. ? Do your daily activities more slowly than normal. ? Eat foods that are soft and easy to digest. ? Rest often.  Take over-the-counter or prescription medicines only as told by your doctor.  It is up to you to get the results of your procedure. Ask your doctor, or the department performing the procedure, when your results will be ready. To help cramping and bloating:  Try walking around.  Put heat on your belly (abdomen) as told by your doctor. Use a heat source that your doctor recommends, such as a moist heat pack or a heating pad. ? Put a towel between your skin and the heat source. ? Leave the heat on for 20-30 minutes. ? Remove the heat if your skin turns bright red. This is especially important if you cannot feel pain, heat, or cold. You can get burned. Eating and drinking  Drink enough fluid to keep your pee (urine) clear or pale yellow.  Return to your normal diet as told by your doctor. Avoid heavy or fried foods that are hard to digest.  Avoid drinking alcohol for as long as told by your doctor. Contact a doctor if:  You have blood in your poop (stool) 2-3 days after the procedure. Get help right away if:  You have more than a small amount of blood in your poop.  You see large clumps of tissue (blood clots) in your poop.  Your belly is swollen.  You feel sick to your stomach  (nauseous).  You throw up (vomit).  You have a fever.  You have belly pain that gets worse, and medicine does not help your pain. This information is not intended to replace advice given to you by your health care provider. Make sure you discuss any questions you have with your health care provider. Document Released: 05/13/2010 Document Revised: 01/03/2016 Document Reviewed: 01/03/2016 Elsevier Interactive Patient Education  2017 Else[employer] Inc.     Diverticulosis Diverticulosis is a condition that develops when small pouches (diverticula) form in the wall of the large intestine (colon). The colon is where water is absorbed and stool is formed. The pouches form when the inside layer of the colon pushes through weak spots in the outer layers of the colon. You may have a few pouches or many of them. What are the causes? The cause of this condition is not known. What increases the risk? The following factors may make you more likely to develop this condition:  Being older than age 38. Your risk for this condition increases with age. Diverticulosis is rare among people younger than age 51. By age 58, many people have it.  Eating a low-fiber diet.  Having frequent constipation.  Being overweight.  Not getting enough exercise.  Smoking.  Taking over-the-counter pain medicines, like aspirin and ibuprofen.  Having a family history of diverticulosis.  What are the signs or symptoms? In most people,  there are no symptoms of this condition. If you do have symptoms, they may include:  Bloating.  Cramps in the abdomen.  Constipation or diarrhea.  Pain in the lower left side of the abdomen.  How is this diagnosed? This condition is most often diagnosed during an exam for other colon problems. Because diverticulosis usually has no symptoms, it often cannot be diagnosed independently. This condition may be diagnosed by:  Using a flexible scope to examine the colon  (colonoscopy).  Taking an X-ray of the colon after dye has been put into the colon (barium enema).  Doing a CT scan.  How is this treated? You may not need treatment for this condition if you have never developed an infection related to diverticulosis. If you have had an infection before, treatment may include:  Eating a high-fiber diet. This may include eating more fruits, vegetables, and grains.  Taking a fiber supplement.  Taking a live bacteria supplement (probiotic).  Taking medicine to relax your colon.  Taking antibiotic medicines.  Follow these instructions at home:  Drink 6-8 glasses of water or more each day to prevent constipation.  Try not to strain when you have a bowel movement.  If you have had an infection before: ? Eat more fiber as directed by your health care provider or your diet and nutrition specialist (dietitian). ? Take a fiber supplement or probiotic, if your health care provider approves.  Take over-the-counter and prescription medicines only as told by your health care provider.  If you were prescribed an antibiotic, take it as told by your health care provider. Do not stop taking the antibiotic even if you start to feel better.  Keep all follow-up visits as told by your health care provider. This is important. Contact a health care provider if:  You have pain in your abdomen.  You have bloating.  You have cramps.  You have not had a bowel movement in 3 days. Get help right away if:  Your pain gets worse.  Your bloating becomes very bad.  You have a fever or chills, and your symptoms suddenly get worse.  You vomit.  You have bowel movements that are bloody or black.  You have bleeding from your rectum. Summary  Diverticulosis is a condition that develops when small pouches (diverticula) form in the wall of the large intestine (colon).  You may have a few pouches or many of them.  This condition is most often diagnosed during an  exam for other colon problems.  If you have had an infection related to diverticulosis, treatment may include increasing the fiber in your diet, taking supplements, or taking medicines. This information is not intended to replace advice given to you by your health care provider. Make sure you discuss any questions you have with your health care provider. Document Released: 01/06/2004 Document Revised: 02/28/2016 Document Reviewed: 02/28/2016 Elsevier Interactive Patient Education  2017 [employer].    High-Fiber Diet Fiber, also called dietary fiber, is a type of carbohydrate found in fruits, vegetables, whole grains, and beans. A high-fiber diet can have many health benefits. Your health care provider may recommend a high-fiber diet to help:  Prevent constipation. Fiber can make your bowel movements more regular.  Lower your cholesterol.  Relieve hemorrhoids, uncomplicated diverticulosis, or irritable bowel syndrome.  Prevent overeating as part of a weight-loss plan.  Prevent heart disease, type 2 diabetes, and certain cancers.  What is my plan? The recommended daily intake of fiber includes:  38 grams  for men under age 6.  69 grams for men over age 79.  69 grams for women under age 9.  3 grams for women over age 47.  You can get the recommended daily intake of dietary fiber by eating a variety of fruits, vegetables, grains, and beans. Your health care provider may also recommend a fiber supplement if it is not possible to get enough fiber through your diet. What do I need to know about a high-fiber diet?  Fiber supplements have not been widely studied for their effectiveness, so it is better to get fiber through food sources.  Always check the fiber content on thenutrition facts label of any prepackaged food. Look for foods that contain at least 5 grams of fiber per serving.  Ask your dietitian if you have questions about specific foods that are related to your  condition, especially if those foods are not listed in the following section.  Increase your daily fiber consumption gradually. Increasing your intake of dietary fiber too quickly may cause bloating, cramping, or gas.  Drink plenty of water. Water helps you to digest fiber. What foods can I eat? Grains Whole-grain breads. Multigrain cereal. Oats and oatmeal. Brown rice. Barley. Bulgur wheat. [place]. Bran muffins. Popcorn. Rye wafer crackers. Vegetables Sweet potatoes. Spinach. Kale. Artichokes. Cabbage. Broccoli. Green peas. Carrots. Squash. Fruits Berries. Pears. Apples. Oranges. Avocados. Prunes and raisins. Dried figs. Meats and Other Protein Sources [place], kidney, pinto, and soy beans. Split peas. Lentils. Nuts and seeds. Dairy Fiber-fortified yogurt. Beverages Fiber-fortified soy milk. Fiber-fortified orange juice. Other Fiber bars. The items listed above may not be a complete list of recommended foods or beverages. Contact your dietitian for more options. What foods are not recommended? Grains White bread. Pasta made with refined flour. White rice. Vegetables Fried potatoes. Canned vegetables. Well-cooked vegetables. Fruits Fruit juice. Cooked, strained fruit. Meats and Other Protein Sources Fatty cuts of meat. Fried Sales executive or fried fish. Dairy Milk. Yogurt. Cream cheese. Sour cream. Beverages Soft drinks. Other Cakes and pastries. Butter and oils. The items listed above may not be a complete list of foods and beverages to avoid. Contact your dietitian for more information. What are some tips for including high-fiber foods in my diet?  Eat a wide variety of high-fiber foods.  Make sure that half of all grains consumed each day are whole grains.  Replace breads and cereals made from refined flour or white flour with whole-grain breads and cereals.  Replace white rice with brown rice, bulgur wheat, or millet.  Start the day with a breakfast that is high in fiber,  such as a cereal that contains at least 5 grams of fiber per serving.  Use beans in place of meat in soups, salads, or pasta.  Eat high-fiber snacks, such as berries, raw vegetables, nuts, or popcorn. This information is not intended to replace advice given to you by your health care provider. Make sure you discuss any questions you have with your health care provider. Document Released: 04/10/2005 Document Revised: 09/16/2015 Document Reviewed: 09/23/2013 Elsevier Interactive Patient Education  [employer].

## 2018-03-18 NOTE — Progress Notes (Signed)
No further complaints of nausea.

## 2018-03-18 NOTE — Op Note (Signed)
[facility] Patient Name: Devon Mora Procedure Date: 03/18/2018 8:11 AM MRN: [id number] Date of Birth: [birth year] Attending MD: Hildred Laser , MD CSN: [id number] Age: 52 Admit Type: Outpatient Procedure:                Colonoscopy Indications:              Screening for colorectal malignant neoplasm Providers:                Hildred Laser, MD, Otis Peak B. Sharon Seller, RN, Nelma Rothman, Technician Referring MD:             Grace Bushy. Wolfgang Phoenix, MD Medicines:                Meperidine 75 mg IV, Midazolam 6 mg IV Complications:            No immediate complications. Estimated Blood Loss:     Estimated blood loss was minimal. Procedure:                Pre-Anesthesia Assessment:                           - Prior to the procedure, a History and Physical                            was performed, and patient medications and                            allergies were reviewed. The patient's tolerance of                            previous anesthesia was also reviewed. The risks                            and benefits of the procedure and the sedation                            options and risks were discussed with the patient.                            All questions were answered, and informed consent                            was obtained. Prior Anticoagulants: The patient has                            taken no previous anticoagulant or antiplatelet                            agents. ASA Grade Assessment: II - A patient with                            mild systemic disease. After reviewing the risks  and benefits, the patient was deemed in                            satisfactory condition to undergo the procedure.                           After obtaining informed consent, the colonoscope                            was passed under direct vision. Throughout the                            procedure, the patient's blood pressure, pulse, and                             oxygen saturations were monitored continuously. The                            PCF-[id number] ([id number]) was introduced through the                            anus and advanced to the the cecum, identified by                            appendiceal orifice and ileocecal valve. The                            colonoscopy was performed without difficulty. The                            patient tolerated the procedure well. The quality                            of the bowel preparation was excellent. The                            ileocecal valve, appendiceal orifice, and rectum                            were photographed. Scope In: 8:27:25 AM Scope Out: 8:46:33 AM Scope Withdrawal Time: 0 hours 13 minutes 6 seconds  Total Procedure Duration: 0 hours 19 minutes 8 seconds  Findings:      The perianal and digital rectal examinations were normal.      A 4 mm polyp was found in the distal transverse colon. The polyp was       sessile. The polyp was removed with a cold snare. Resection and       retrieval were complete.      Multiple medium-mouthed diverticula were found in the sigmoid colon.      External hemorrhoids were found during retroflexion. The hemorrhoids       were small. Impression:               - One 4 mm polyp in the distal transverse colon,  removed with a cold snare. Resected and retrieved.                           - Diverticulosis in the sigmoid colon.                           - External hemorrhoids. Moderate Sedation:      Moderate (conscious) sedation was administered by the endoscopy nurse       and supervised by the endoscopist. The following parameters were       monitored: oxygen saturation, heart rate, blood pressure, CO2       capnography and response to care. Total physician intraservice time was       25 minutes. Recommendation:           - Patient has a contact number available for                             emergencies. The signs and symptoms of potential                            delayed complications were discussed with the                            patient. Return to normal activities tomorrow.                            Written discharge instructions were provided to the                            patient.                           - High fiber diet today.                           - Continue present medications.                           - No aspirin, ibuprofen, naproxen, or other                            non-steroidal anti-inflammatory drugs for 1 day.                           - Await pathology results.                           - Repeat colonoscopy is recommended. The                            colonoscopy date will be determined after pathology                            results from today's exam become available for  review. Procedure Code(s):        --- Professional ---                           [phone], Colonoscopy, flexible; with removal of                            tumor(s), polyp(s), or other lesion(s) by snare                            technique                           99153, Moderate sedation; each additional 15                            minutes intraservice time                           G0500, Moderate sedation services provided by the                            same physician or other qualified health care                            professional performing a gastrointestinal                            endoscopic service that sedation supports,                            requiring the presence of an independent trained                            observer to assist in the monitoring of the                            patient's level of consciousness and physiological                            status; initial 15 minutes of intra-service time;                            patient age 79 years or older (additional time may                             be reported with [phone], as appropriate) Diagnosis Code(s):        --- Professional ---                           Z12.11, Encounter for screening for malignant                            neoplasm of colon  D12.3, Benign neoplasm of transverse colon (hepatic                            flexure or splenic flexure)                           K64.4, Residual hemorrhoidal skin tags                           K57.30, Diverticulosis of large intestine without                            perforation or abscess without bleeding CPT copyright 2018 American Medical Association. All rights reserved. The codes documented in this report are preliminary and upon coder review may  be revised to meet current compliance requirements. Hildred Laser, MD Hildred Laser, MD 03/18/2018 8:55:38 AM This report has been signed electronically. Number of Addenda: 0

## 2018-03-18 NOTE — H&P (Signed)
Devon Mora is an 52 y.o. male.   Chief Complaint: Patient is here for colonoscopy. HPI: Patient is 52 year old Caucasian male who is here for screening colonoscopy.  He denies abdominal pain change in bowel habits or rectal bleeding. This is patient's first exam. Family history is negative for CRC.  Past Medical History:  Diagnosis Date  . Allergic rhinitis   . CTS (carpal tunnel syndrome)   . Hyperlipidemia   . Hypertension   . IFG (impaired fasting glucose)   . Obesity     Past Surgical History:  Procedure Laterality Date  . right rotator cuff    . VASECTOMY      Family History  Problem Relation Age of Onset  . Hypertension Mother   . Cancer Father        Renal cell   Social History:  reports that he has never smoked. He has never used smokeless tobacco. He reports that he does not drink alcohol or use drugs.  Allergies:  Allergies  Allergen Reactions  . Dust Mite Extract Other (See Comments)    Chest tightness/runny nose/sneezing  . Phentermine     GI symptoms  . Pollen Extract Other (See Comments)    Chest tightness/runny nose/sneezing     Medications Prior to Admission  Medication Sig Dispense Refill  . acetaminophen (TYLENOL) 500 MG tablet Take 1,000 mg by mouth every 6 (six) hours as needed (for pain.).    Marland Kitchen albuterol (PROAIR HFA) 108 (90 Base) MCG/ACT inhaler INHALE TWO PUFFS BY MOUTH EVERY 4 HOURS AS NEEDED FOR WHEEZING (Patient taking differently: Inhale 2 puffs into the lungs every 4 (four) hours as needed for wheezing. ) 9 each 5  . enalapril (VASOTEC) 10 MG tablet TAKE ONE (1) TABLET BY MOUTH EVERY DAY (Patient taking differently: Take 10 mg by mouth at bedtime. ) 90 tablet 1  . montelukast (SINGULAIR) 10 MG tablet Take 1 tablet (10 mg total) by mouth at bedtime. 90 tablet 1  . Multiple Vitamins-Minerals (MULTIVITAMIN WITH MINERALS) tablet Take 1 tablet by mouth daily.    Marland Kitchen oxymetazoline (AFRIN NASAL SPRAY) 0.05 % nasal spray Place 1 spray into both  nostrils 2 (two) times daily. (Patient taking differently: Place 1 spray into both nostrils 2 (two) times daily as needed (congestion/stuffy nose). ) 30 mL 0  . SUPREP BOWEL PREP KIT 17.5-3.13-1.6 GM/177ML SOLN Take 354 mLs by mouth once.  0  . diazepam (VALIUM) 5 MG tablet Take 1 tablet by mouth every 6 hours as needed for vertigo (Patient taking differently: Take 5 mg by mouth every 6 (six) hours as needed (for vertigo). Take 1 tablet by mouth every 6 hours as needed for vertigo) 24 tablet 0    No results found for this or any previous visit (from the past 48 hour(s)). No results found.  ROS  Blood pressure (!) 165/94, pulse 79, temperature 98.8 F (37.1 C), temperature source Oral, resp. rate 16, height '5\' 9"'  (1.753 m), weight 112 kg, SpO2 98 %. Physical Exam  Constitutional: He appears well-developed and well-nourished.  HENT:  Mouth/Throat: Oropharynx is clear and moist.  Eyes: Conjunctivae are normal. No scleral icterus.  Neck: No thyromegaly present.  Cardiovascular: Normal rate, regular rhythm and normal heart sounds.  No murmur heard. Respiratory: Effort normal and breath sounds normal.  GI:  Abdomen is full.  Is soft and nontender with organomegaly or masses.  Musculoskeletal: He exhibits no edema.  Lymphadenopathy:    He has no cervical adenopathy.  Neurological:  He is alert.  Skin: Skin is warm and dry.     Assessment/Plan Average risk screening colonoscopy.  Hildred Laser, MD 03/18/2018, 8:15 AM

## 2018-03-25 ENCOUNTER — Encounter (HOSPITAL_COMMUNITY): Payer: Self-pay | Admitting: Internal Medicine

## 2018-06-18 DIAGNOSIS — Z029 Encounter for administrative examinations, unspecified: Secondary | ICD-10-CM

## 2018-08-12 ENCOUNTER — Other Ambulatory Visit: Payer: Self-pay | Admitting: Family Medicine

## 2018-08-13 ENCOUNTER — Ambulatory Visit: Payer: BC Managed Care – PPO | Admitting: Family Medicine

## 2018-08-13 NOTE — Telephone Encounter (Signed)
Need six mo virtual visit

## 2018-08-13 NOTE — Telephone Encounter (Signed)
Patient scheduled virtual visit with Dr Richardson Landry this week

## 2018-08-14 ENCOUNTER — Other Ambulatory Visit: Payer: Self-pay

## 2018-08-14 ENCOUNTER — Encounter: Payer: Self-pay | Admitting: Family Medicine

## 2018-08-14 ENCOUNTER — Ambulatory Visit (INDEPENDENT_AMBULATORY_CARE_PROVIDER_SITE_OTHER): Payer: BC Managed Care – PPO | Admitting: Family Medicine

## 2018-08-14 DIAGNOSIS — I10 Essential (primary) hypertension: Secondary | ICD-10-CM

## 2018-08-14 DIAGNOSIS — J4520 Mild intermittent asthma, uncomplicated: Secondary | ICD-10-CM | POA: Diagnosis not present

## 2018-08-14 MED ORDER — ENALAPRIL MALEATE 10 MG PO TABS: ORAL_TABLET | ORAL | 5 refills | Status: DC

## 2018-08-14 NOTE — Progress Notes (Addendum)
   Subjective:    Patient ID: Devon Mora, male    DOB: [birth year], 53 y.o.   MRN: [id number] Audio plus visual Hypertension  This is a chronic problem. The current episode started more than 1 year ago. Risk factors for coronary artery disease include male gender. Treatments tried: vasotec. There are no compliance problems.    Virtual Visit via Video Note  I connected with Devon Mora on 08/14/18 at 11:00 AM EDT by a video enabled telemedicine application and verified that I am speaking with the correct person using two identifiers.   I discussed the limitations of evaluation and management by telemedicine and the availability of in person appointments. The patient expressed understanding and agreed to proceed.  History of Present Illness:    Observations/Objective:   Assessment and Plan:   Follow Up Instructions:    I discussed the assessment and treatment plan with the patient. The patient was provided an opportunity to ask questions and all were answered. The patient agreed with the plan and demonstrated an understanding of the instructions.   The patient was advised to call back or seek an in-person evaluation if the symptoms worsen or if the condition fails to improve as anticipated.  I provided  minutes of non-face-to-face time during this encounter.   Blood pressure medicine and blood pressure levels reviewed today with patient. Compliant with blood pressure medicine. States does not miss a dose. No obvious side effects. Blood pressure generally good when checked elsewhere. Watching salt intake.  Allergies and asthma overall in good control.  Maintaining Singulair.  No obvious side effects.  Uses albuterol on occasion   Review of Systems No headache, no major weight loss or weight gain, no chest pain no back pain abdominal pain no change in bowel habits complete ROS otherwise negative     Objective:   Physical Exam   Virtual visit     Assessment & Plan:   Impression 1 hypertension.  Good control.  Blood pressure excellent when checked elsewhere  2.  Allergic rhinitis/asthma clinically stable maintain Singulair as needed albuterol  Follow-up in 6 months for wellness plus chronic

## 2018-08-14 NOTE — Addendum Note (Signed)
Addended by: Dairl Ponder on: 08/14/2018 11:22 AM   Modules accepted: Orders

## 2019-01-09 ENCOUNTER — Ambulatory Visit (INDEPENDENT_AMBULATORY_CARE_PROVIDER_SITE_OTHER): Payer: BC Managed Care – PPO | Admitting: Otolaryngology

## 2019-01-09 DIAGNOSIS — H903 Sensorineural hearing loss, bilateral: Secondary | ICD-10-CM

## 2019-03-04 ENCOUNTER — Telehealth: Payer: Self-pay | Admitting: Family Medicine

## 2019-03-04 ENCOUNTER — Other Ambulatory Visit: Payer: Self-pay | Admitting: *Deleted

## 2019-03-04 MED ORDER — ENALAPRIL MALEATE 10 MG PO TABS: ORAL_TABLET | ORAL | 0 refills | Status: DC

## 2019-03-04 NOTE — Telephone Encounter (Signed)
Pt is requesting refill on enalapril (VASOTEC) 10 MG tablet. Please send to [place], [id number]  Pt has CPE scheduled 11/18.

## 2019-03-04 NOTE — Telephone Encounter (Signed)
30 day supply sent in per dr. Pt notified.

## 2019-03-12 ENCOUNTER — Other Ambulatory Visit: Payer: Self-pay

## 2019-03-12 ENCOUNTER — Ambulatory Visit (INDEPENDENT_AMBULATORY_CARE_PROVIDER_SITE_OTHER): Payer: BC Managed Care – PPO | Admitting: Family Medicine

## 2019-03-12 ENCOUNTER — Encounter: Payer: Self-pay | Admitting: Family Medicine

## 2019-03-12 VITALS — BP 140/84 | Temp 97.6°F | Ht 69.0 in | Wt 265.6 lb

## 2019-03-12 DIAGNOSIS — Z0000 Encounter for general adult medical examination without abnormal findings: Secondary | ICD-10-CM

## 2019-03-12 DIAGNOSIS — I10 Essential (primary) hypertension: Secondary | ICD-10-CM | POA: Diagnosis not present

## 2019-03-12 DIAGNOSIS — Z79899 Other long term (current) drug therapy: Secondary | ICD-10-CM

## 2019-03-12 DIAGNOSIS — E785 Hyperlipidemia, unspecified: Secondary | ICD-10-CM | POA: Diagnosis not present

## 2019-03-12 DIAGNOSIS — Z125 Encounter for screening for malignant neoplasm of prostate: Secondary | ICD-10-CM

## 2019-03-12 DIAGNOSIS — Z23 Encounter for immunization: Secondary | ICD-10-CM | POA: Diagnosis not present

## 2019-03-12 MED ORDER — ENALAPRIL MALEATE 10 MG PO TABS: ORAL_TABLET | ORAL | 1 refills | Status: DC

## 2019-03-12 MED ORDER — MONTELUKAST SODIUM 10 MG PO TABS: 10.0000 mg | ORAL_TABLET | Freq: Every day | ORAL | 1 refills | Status: DC

## 2019-03-12 MED ORDER — DIAZEPAM 5 MG PO TABS: ORAL_TABLET | ORAL | 5 refills | Status: DC

## 2019-03-12 NOTE — Progress Notes (Signed)
   Subjective:    Patient ID: Devon Mora, male    DOB: 10-21-1965, 53 y.o.   MRN: [id number]  HPI The patient comes in today for a wellness visit.  b p  Overall numbers  Decent ,    osten  A little ower       A review of their health history was completed.  A review of medications was also completed.  Any needed refills; enalapril 10 mg  Eating habits: tries to eat healthy   Falls/  MVA accidents in past few months: none  Regular exercise: walks daily   Specialist pt sees on regular basis: hearing aid doctor and eye dr.   Preventative health issues were discussed.   Additional concerns:   Blood pressure medicine and blood pressure levels reviewed today with patient. Compliant with blood pressure medicine. States does not miss a dose. No obvious side effects. Blood pressure generally good when checked elsewhere. Watching salt intake.  Exercising staying active   Walking  Reg        Review of Systems  Constitutional: Negative for activity change, appetite change and fever.  HENT: Negative for congestion and rhinorrhea.   Eyes: Negative for discharge.  Respiratory: Negative for cough and wheezing.   Cardiovascular: Negative for chest pain.  Gastrointestinal: Negative for abdominal pain, blood in stool and vomiting.  Genitourinary: Negative for difficulty urinating and frequency.  Musculoskeletal: Negative for neck pain.  Skin: Negative for rash.  Allergic/Immunologic: Negative for environmental allergies and food allergies.  Neurological: Negative for weakness and headaches.  Psychiatric/Behavioral: Negative for agitation.  All other systems reviewed and are negative.      Objective:   Physical Exam Vitals signs reviewed.  Constitutional:      Appearance: He is well-developed.  HENT:     Head: Normocephalic and atraumatic.     Right Ear: External ear normal.     Left Ear: External ear normal.     Nose: Nose normal.  Eyes:     Pupils: Pupils are  equal, round, and reactive to light.  Neck:     Musculoskeletal: Normal range of motion and neck supple.     Thyroid: No thyromegaly.  Cardiovascular:     Rate and Rhythm: Normal rate and regular rhythm.     Heart sounds: Normal heart sounds. No murmur.  Pulmonary:     Effort: Pulmonary effort is normal. No respiratory distress.     Breath sounds: Normal breath sounds. No wheezing.  Abdominal:     General: Bowel sounds are normal. There is no distension.     Palpations: Abdomen is soft. There is no mass.     Tenderness: There is no abdominal tenderness.  Genitourinary:    Penis: Normal.   Musculoskeletal: Normal range of motion.  Lymphadenopathy:     Cervical: No cervical adenopathy.  Skin:    General: Skin is warm and dry.     Findings: No erythema.  Neurological:     Mental Status: He is alert.     Motor: No abnormal muscle tone.  Psychiatric:        Behavior: Behavior normal.        Judgment: Judgment normal.    Declines prostate test today       Assessment & Plan:  Impression 1 wellness 7 yrs til next  Fu , declines not a flu shot/ tdap today needed, blood work ordered.  Exercise diet discussed.  Medications refilled follow-up in 6 months

## 2019-03-18 LAB — HEPATIC FUNCTION PANEL
ALT: 30 IU/L (ref 0–44)
AST: 23 IU/L (ref 0–40)
Albumin: 4.6 g/dL (ref 3.8–4.9)
Alkaline Phosphatase: 51 IU/L (ref 39–117)
Bilirubin Total: 1 mg/dL (ref 0.0–1.2)
Bilirubin, Direct: 0.19 mg/dL (ref 0.00–0.40)
Total Protein: 7.2 g/dL (ref 6.0–8.5)

## 2019-03-18 LAB — BASIC METABOLIC PANEL
BUN/Creatinine Ratio: 16 (ref 9–20)
BUN: 18 mg/dL (ref 6–24)
CO2: 22 mmol/L (ref 20–29)
Calcium: 9.3 mg/dL (ref 8.7–10.2)
Chloride: 103 mmol/L (ref 96–106)
Creatinine, Ser: 1.14 mg/dL (ref 0.76–1.27)
GFR calc Af Amer: 83 mL/min/{1.73_m2} (ref >59)
GFR calc non Af Amer: 72 mL/min/{1.73_m2} (ref >59)
Glucose: 109 mg/dL — ABNORMAL HIGH (ref 65–99)
Potassium: 4.5 mmol/L (ref 3.5–5.2)
Sodium: 140 mmol/L (ref 134–144)

## 2019-03-18 LAB — PSA: Prostate Specific Ag, Serum: 0.2 ng/mL (ref 0.0–4.0)

## 2019-03-18 LAB — LIPID PANEL
Chol/HDL Ratio: 6.2 ratio — ABNORMAL HIGH (ref 0.0–5.0)
Cholesterol, Total: 223 mg/dL — ABNORMAL HIGH (ref 100–199)
HDL: 36 mg/dL — ABNORMAL LOW (ref >39)
LDL Chol Calc (NIH): 138 mg/dL — ABNORMAL HIGH (ref 0–99)
Triglycerides: 271 mg/dL — ABNORMAL HIGH (ref 0–149)
VLDL Cholesterol Cal: 49 mg/dL — ABNORMAL HIGH (ref 5–40)

## 2019-03-21 MED ORDER — ATORVASTATIN CALCIUM 20 MG PO TABS: ORAL_TABLET | ORAL | 1 refills | Status: DC

## 2019-03-21 NOTE — Addendum Note (Signed)
Addended by: Vicente Males on: 03/21/2019 11:35 AM   Modules accepted: Orders

## 2019-04-14 ENCOUNTER — Ambulatory Visit: Payer: BC Managed Care – PPO | Attending: Internal Medicine

## 2019-04-14 ENCOUNTER — Other Ambulatory Visit: Payer: Self-pay

## 2019-04-14 DIAGNOSIS — Z20822 Contact with and (suspected) exposure to covid-19: Secondary | ICD-10-CM

## 2019-04-15 LAB — NOVEL CORONAVIRUS, NAA: SARS-CoV-2, NAA: NOT DETECTED

## 2019-05-14 ENCOUNTER — Ambulatory Visit: Payer: BC Managed Care – PPO | Attending: Internal Medicine

## 2019-05-14 ENCOUNTER — Other Ambulatory Visit: Payer: Self-pay

## 2019-05-14 DIAGNOSIS — Z20822 Contact with and (suspected) exposure to covid-19: Secondary | ICD-10-CM

## 2019-05-15 LAB — NOVEL CORONAVIRUS, NAA: SARS-CoV-2, NAA: NOT DETECTED

## 2019-05-28 ENCOUNTER — Encounter: Payer: Self-pay | Admitting: Family Medicine

## 2019-07-23 ENCOUNTER — Ambulatory Visit (INDEPENDENT_AMBULATORY_CARE_PROVIDER_SITE_OTHER): Payer: BC Managed Care – PPO | Admitting: Family Medicine

## 2019-07-23 VITALS — BP 122/78 | Temp 98.4°F | Ht 69.0 in | Wt 262.8 lb

## 2019-07-23 DIAGNOSIS — R42 Dizziness and giddiness: Secondary | ICD-10-CM | POA: Diagnosis not present

## 2019-07-23 DIAGNOSIS — H66001 Acute suppurative otitis media without spontaneous rupture of ear drum, right ear: Secondary | ICD-10-CM

## 2019-07-23 DIAGNOSIS — J4520 Mild intermittent asthma, uncomplicated: Secondary | ICD-10-CM

## 2019-07-23 DIAGNOSIS — H66009 Acute suppurative otitis media without spontaneous rupture of ear drum, unspecified ear: Secondary | ICD-10-CM | POA: Insufficient documentation

## 2019-07-23 MED ORDER — AMOXICILLIN-POT CLAVULANATE 875-125 MG PO TABS: 1.0000 | ORAL_TABLET | Freq: Two times a day (BID) | ORAL | 0 refills | Status: DC

## 2019-07-23 MED ORDER — ALBUTEROL SULFATE HFA 108 (90 BASE) MCG/ACT IN AERS: INHALATION_SPRAY | RESPIRATORY_TRACT | 2 refills | Status: DC

## 2019-07-23 NOTE — Progress Notes (Deleted)
   Subjective:    Patient ID: Devon Mora, male    DOB: 1965-08-27, 54 y.o.   MRN: [id number]  HPI  Patient arrives with flare of vertigo since 5 days ago. Pt just woke up with the symptoms. Patient states he has a history of vertigo and has been given valium in the past. Patient states that he noticed that his allergies were starting to act up and having pressure in his ears and then the vertigo started Friday.  Denies headache, blurry vision, weakness in limbs, facial droop/numbness.    Pt stating this seems to start with "allergy season."  Feeling fluid in the ear.  Vomiting and sweating on Friday.  Sitting still feeling ok, but with bending over can feel dizziness.  After 2 days symptoms improved usually.  meds taken for this- allegra occ works then wears off. Used 1-2 valium tablets prn. Over weekend had some blurriness, but has improved.  Been able to drive since Monday. 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 H/o hearing aid.  Pt seeing ENT- said build up on ear drum and has scarring and has some hearing loss, 3 yrs ago. 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  Runny nose last week, but resolved.  Tylenol for headache over weekend, denies headache today.                                 Review of Systems     Objective:   Physical Exam        Assessment & Plan:

## 2019-07-23 NOTE — Progress Notes (Signed)
Established Patient Office Visit  Subjective:  Patient ID: Devon Mora, male    DOB: 21-May-1965  Age: 54 y.o. MRN: [id number]  CC:  Chief Complaint  Patient presents with  . Dizziness    HPI  Patient arrives with concern of dizziness since 5 days ago. Pt just woke up with the symptoms. Patient states he has a history of vertigo and has been given valium in the past. Patient states that he noticed that his allergies were starting to act up 1 wk ago and having pressure in his ears and then the vertigo started Friday.  Denies headache, blurry vision, weakness in limbs, facial droop/numbness.  His allergy symptoms from nose and throat have resolved.   Pt stating this seems to start with "allergy season."  Feeling fluid in the rt ear.  Vomiting and sweating on Friday, but has now resolved.  When sitting still feeling ok, but with bending over can feel dizziness.  After 2 days symptoms improved usually.  meds taken for this- allegra occasionally works then wears off. Used 1-2 valium tablets prn. Over weekend had some blurriness, but has improved.  Been able to drive since Monday. 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 H/o hearing aid.  Pt seeing ENT- said build up on ear drum and has scarring and has some hearing loss, 3 yrs ago. He believes was brought on by a severe Rt ear infection. 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  Runny nose last week, but resolved.  Tylenol for headache over weekend, denies headache today.   Past Medical History:  Diagnosis Date  . Allergic rhinitis   . CTS (carpal tunnel syndrome)   . Hyperlipidemia   . Hypertension   . IFG (impaired fasting glucose)   . Obesity     Past Surgical History:  Procedure Laterality Date  . COLONOSCOPY N/A 03/18/2018   Procedure: COLONOSCOPY;  Surgeon: Rogene Houston, MD;  Location: [facility];  Service: Endoscopy;  Laterality: N/A;  830  . POLYPECTOMY  03/18/2018   Procedure: POLYPECTOMY;  Surgeon: Rogene Houston, MD;   Location: [facility];  Service: Endoscopy;;  colon  . right rotator cuff    . VASECTOMY      Family History  Problem Relation Age of Onset  . Hypertension Mother   . Cancer Father        Renal cell    Social History   Socioeconomic History  . Marital status: Married    Spouse name: Not on file  . Number of children: Not on file  . Years of education: Not on file  . Highest education level: Not on file  Occupational History  . Not on file  Tobacco Use  . Smoking status: Never Smoker  . Smokeless tobacco: Never Used  Substance and Sexual Activity  . Alcohol use: No  . Drug use: No  . Sexual activity: Not on file  Other Topics Concern  . Not on file  Social History Narrative  . Not on file   Social Determinants of Health   Financial Resource Strain:   . Difficulty of Paying Living Expenses:   Food Insecurity:   . Worried About Charity fundraiser in the Last Year:   . Arboriculturist in the Last Year:   Transportation Needs:   . Film/video editor (Medical):   Marland Kitchen Lack of Transportation (Non-Medical):   Physical Activity:   . Days of Exercise per Week:   .  Minutes of Exercise per Session:   Stress:   . Feeling of Stress :   Social Connections:   . Frequency of Communication with Friends and Family:   . Frequency of Social Gatherings with Friends and Family:   . Attends Religious Services:   . Active Member of Clubs or Organizations:   . Attends Archivist Meetings:   Marland Kitchen Marital Status:   Intimate Partner Violence:   . Fear of Current or Ex-Partner:   . Emotionally Abused:   Marland Kitchen Physically Abused:   . Sexually Abused:     Outpatient Medications Prior to Visit  Medication Sig Dispense Refill  . acetaminophen (TYLENOL) 500 MG tablet Take 1,000 mg by mouth every 6 (six) hours as needed (for pain.).    Marland Kitchen atorvastatin (LIPITOR) 20 MG tablet Take one tablet po qhs 90 tablet 1  . diazepam (VALIUM) 5 MG tablet Take 1 tablet by mouth every 6 hours as  needed for vertigo 24 tablet 5  . enalapril (VASOTEC) 10 MG tablet Take 1 tablet by mouth once daily 90 tablet 1  . montelukast (SINGULAIR) 10 MG tablet Take 1 tablet (10 mg total) by mouth at bedtime. 90 tablet 1  . Multiple Vitamins-Minerals (MULTIVITAMIN WITH MINERALS) tablet Take 1 tablet by mouth daily.    Marland Kitchen albuterol (PROAIR HFA) 108 (90 Base) MCG/ACT inhaler INHALE TWO PUFFS BY MOUTH EVERY 4 HOURS AS NEEDED FOR WHEEZING (Patient taking differently: Inhale 2 puffs into the lungs every 4 (four) hours as needed for wheezing. ) 9 each 5  . oxymetazoline (AFRIN NASAL SPRAY) 0.05 % nasal spray Place 1 spray into both nostrils 2 (two) times daily. (Patient taking differently: Place 1 spray into both nostrils 2 (two) times daily as needed (congestion/stuffy nose). ) 30 mL 0  . SUPREP BOWEL PREP KIT 17.5-3.13-1.6 GM/177ML SOLN Take 354 mLs by mouth once.  0   No facility-administered medications prior to visit.    Allergies  Allergen Reactions  . Dust Mite Extract Other (See Comments)    Chest tightness/runny nose/sneezing  . Phentermine     GI symptoms  . Pollen Extract Other (See Comments)    Chest tightness/runny nose/sneezing     ROS Review of Systems  Constitutional: Negative for chills and fever.  HENT: Positive for ear pain, hearing loss (chronic on rt, h/o hearing aid) and tinnitus (chronic on right). Negative for congestion, facial swelling, postnasal drip, rhinorrhea, sinus pressure, sinus pain, sneezing and sore throat.   Eyes: Negative for discharge, redness and visual disturbance.  Respiratory: Negative for cough, chest tightness, shortness of breath and wheezing.   Cardiovascular: Negative for chest pain and leg swelling.  Gastrointestinal: Negative for abdominal pain, diarrhea, nausea and vomiting.  Genitourinary: Negative for dysuria and frequency.  Musculoskeletal: Negative for myalgias.  Skin: Negative.   Neurological: Positive for dizziness (improving). Negative for  syncope, speech difficulty, weakness, numbness and headaches.      Objective:    Physical Exam  Constitutional: He is oriented to person, place, and time. He appears well-developed and well-nourished.  HENT:  Head: Normocephalic and atraumatic.  Right Ear: External ear and ear canal normal. No drainage, swelling or tenderness. Tympanic membrane is injected and scarred. Tympanic membrane is not perforated, not erythematous, not retracted and not bulging. A middle ear effusion (purulent) is present. Decreased hearing is noted.  Left Ear: Hearing and external ear normal. No drainage or tenderness. Tympanic membrane is not injected. No decreased hearing is noted.  Nose: Nose  normal.  Mouth/Throat: Oropharynx is clear and moist and mucous membranes are normal. No oropharyngeal exudate, posterior oropharyngeal edema or tonsillar abscesses.  Eyes: Pupils are equal, round, and reactive to light. Conjunctivae and EOM are normal.  Cardiovascular: Normal rate and regular rhythm. Exam reveals no gallop and no friction rub.  No murmur heard. Pulmonary/Chest: Effort normal and breath sounds normal. No respiratory distress. He has no wheezes. He has no rales.  Musculoskeletal:        General: No edema. Normal range of motion.     Cervical back: Normal range of motion and neck supple.  Neurological: He is alert and oriented to person, place, and time. No cranial nerve deficit. Coordination normal.  Skin: Skin is warm and dry. No rash noted.  Psychiatric: He has a normal mood and affect.    BP 122/78   Temp 98.4 F (36.9 C) (Oral)   Ht '5\' 9"'  (1.753 m)   Wt 262 lb 12.8 oz (119.2 kg)   BMI 38.81 kg/m  Wt Readings from Last 3 Encounters:  07/23/19 262 lb 12.8 oz (119.2 kg)  03/12/19 265 lb 9.6 oz (120.5 kg)  03/18/18 247 lb (112 kg)     Health Maintenance Due  Topic Date Due  . Hepatitis C Screening  Never done  . HIV Screening  Never done    There are no preventive care reminders to  display for this patient.  No results found for: TSH Lab Results  Component Value Date   WBC 6.5 02/06/2018   HGB 14.9 02/06/2018   HCT 41.1 02/06/2018   MCV 88 02/06/2018   PLT 248 02/06/2018   Lab Results  Component Value Date   NA 140 03/17/2019   K 4.5 03/17/2019   CO2 22 03/17/2019   GLUCOSE 109 (H) 03/17/2019   BUN 18 03/17/2019   CREATININE 1.14 03/17/2019   BILITOT 1.0 03/17/2019   ALKPHOS 51 03/17/2019   AST 23 03/17/2019   ALT 30 03/17/2019   PROT 7.2 03/17/2019   ALBUMIN 4.6 03/17/2019   CALCIUM 9.3 03/17/2019   ANIONGAP 10 10/23/2017   Lab Results  Component Value Date   CHOL 223 (H) 03/17/2019   Lab Results  Component Value Date   HDL 36 (L) 03/17/2019   Lab Results  Component Value Date   LDLCALC 138 (H) 03/17/2019   Lab Results  Component Value Date   TRIG 271 (H) 03/17/2019   Lab Results  Component Value Date   CHOLHDL 6.2 (H) 03/17/2019   No results found for: HGBA1C    Assessment & Plan:   1. Non-recurrent acute suppurative otitis media of right ear without spontaneous rupture of tympanic membrane -acute, will give Augmentin for 10 days.  Tylenol/ibuprofen prn for pain.  Call or rto if worsening or severe pain/drainage.  - amoxicillin-clavulanate (AUGMENTIN) 875-125 MG tablet; Take 1 tablet by mouth 2 (two) times daily.  Dispense: 20 tablet; Refill: 0  2. Mild intermittent asthma without complication -stable, no current symptoms.   Needing refill.    - albuterol (PROAIR HFA) 108 (90 Base) MCG/ACT inhaler; INHALE TWO PUFFS BY MOUTH EVERY 4 HOURS AS NEEDED FOR WHEEZING  Dispense: 18 g; Refill: 2  3. Vertigo- likely related to allergic rhinitis/otitis media - resolved, cont to monitor.  Use valium 37m prn. -call or return if worsening. Pt in agreement.   Follow-up: Return if symptoms worsen or fail to improve.    MSouth Renovo DO

## 2019-09-04 LAB — HEPATIC FUNCTION PANEL
ALT: 36 IU/L (ref 0–44)
AST: 27 IU/L (ref 0–40)
Albumin: 4.7 g/dL (ref 3.8–4.9)
Alkaline Phosphatase: 54 IU/L (ref 39–117)
Bilirubin Total: 1.2 mg/dL (ref 0.0–1.2)
Bilirubin, Direct: 0.22 mg/dL (ref 0.00–0.40)
Total Protein: 7.3 g/dL (ref 6.0–8.5)

## 2019-09-04 LAB — LIPID PANEL
Chol/HDL Ratio: 4.1 ratio (ref 0.0–5.0)
Cholesterol, Total: 146 mg/dL (ref 100–199)
HDL: 36 mg/dL — ABNORMAL LOW (ref >39)
LDL Chol Calc (NIH): 69 mg/dL (ref 0–99)
Triglycerides: 251 mg/dL — ABNORMAL HIGH (ref 0–149)
VLDL Cholesterol Cal: 41 mg/dL — ABNORMAL HIGH (ref 5–40)

## 2019-09-09 ENCOUNTER — Ambulatory Visit: Payer: BC Managed Care – PPO | Admitting: Family Medicine

## 2019-09-09 ENCOUNTER — Other Ambulatory Visit: Payer: Self-pay

## 2019-09-09 VITALS — BP 130/78 | Temp 97.5°F | Wt 264.6 lb

## 2019-09-09 DIAGNOSIS — I10 Essential (primary) hypertension: Secondary | ICD-10-CM | POA: Diagnosis not present

## 2019-09-09 DIAGNOSIS — E785 Hyperlipidemia, unspecified: Secondary | ICD-10-CM

## 2019-09-09 DIAGNOSIS — R42 Dizziness and giddiness: Secondary | ICD-10-CM

## 2019-09-09 DIAGNOSIS — F4321 Adjustment disorder with depressed mood: Secondary | ICD-10-CM

## 2019-09-09 MED ORDER — DIAZEPAM 5 MG PO TABS: ORAL_TABLET | ORAL | 1 refills | Status: DC

## 2019-09-09 MED ORDER — ENALAPRIL MALEATE 10 MG PO TABS: ORAL_TABLET | ORAL | 1 refills | Status: DC

## 2019-09-09 MED ORDER — MONTELUKAST SODIUM 10 MG PO TABS: 10.0000 mg | ORAL_TABLET | Freq: Every day | ORAL | 1 refills | Status: DC

## 2019-09-09 NOTE — Progress Notes (Signed)
   Subjective:  Patient presents with numerous concerns  Patient ID: Devon Mora, male    DOB: Dec 09, 1965, 54 y.o.   MRN: [id number]  Hypertension This is a chronic problem. The current episode started more than 1 year ago. Treatments tried: Enalapril. Compliance problems: always moving but purposely walking more often for exercise and diet is pretty good but could be better.   BP at home yesterday was 134/78.  Patient requests refill for valium for vertigo.   Blood pressure medicine and blood pressure levels reviewed today with patient. Compliant with blood pressure medicine. States does not miss a dose. No obvious side effects. Blood pressure generally good when checked elsewhere. Watching salt intake.  Marland Kitchen Results for orders placed or performed in visit on 05/14/19  Novel Coronavirus, NAA ([facility])   Specimen: Nasopharyngeal(NP) swabs in vial transport medium   NASOPHARYNGE  TESTING  Result Value Ref Range   SARS-CoV-2, NAA Not Detected Not Detected    Patient continues to take lipid medication regularly. No obvious side effects from it. Generally does not miss a dose. Prior blood work results are reviewed with patient. Patient continues to work on fat intake in diet  Blood pressure medicine and blood pressure levels reviewed today with patient. Compliant with blood pressure medicine. States does not miss a dose. No obvious side effects. Blood pressure generally good when checked elsewhere. Watching salt intake.  Rare use of valium prn for dizzines.  Does not use very often but it is very helpful  Still having periods of grief and feeling down.  Seem to have gotten worse this winter.  Wonders if it might be associated with statin.  6 years ago his son passed away.  Patient is experiencing ongoing symptoms in this regard.   Review of Systems No headache no chest pain no shortness of breath    Objective:   Physical Exam  Alert and oriented, vitals reviewed and stable,  NAD ENT-TM's and ext canals WNL bilat via otoscopic exam Soft palate, tonsils and post pharynx WNL via oropharyngeal exam Neck-symmetric, no masses; thyroid nonpalpable and nontender Pulmonary-no tachypnea or accessory muscle use; Clear without wheezes via auscultation Card--no abnrml murmurs, rhythm reg and rate WNL Carotid pulses symmetric, without bruits       Assessment & Plan:  Impression 1 hypertension.  Blood pressure good on repeat to maintain same meds diet exercise discussed  2.  Hyperlipidemia.  Blood work reviewed.  Excellent control to maintain same meds  3.  History of intermittent vertigo.  Rare use of Valium definitely helps will refill  4.  Elements of grief.  Protracted.  Ongoing substantial impact on patient.  I have encouraged him to consider going through his EAP at work to get counseling.  Patient does not want to take medication  Follow-up in 6 months diet exercise discussed meds refilled

## 2019-09-18 ENCOUNTER — Encounter: Payer: Self-pay | Admitting: Family Medicine

## 2019-09-18 ENCOUNTER — Other Ambulatory Visit: Payer: Self-pay | Admitting: *Deleted

## 2019-09-18 MED ORDER — ATORVASTATIN CALCIUM 20 MG PO TABS: ORAL_TABLET | ORAL | 1 refills | Status: DC

## 2019-09-18 NOTE — Telephone Encounter (Signed)
Note states to maintain cholesterol med. I sent in refills and let pt know to continue

## 2019-10-14 IMAGING — CR DG CHEST 1V PORT
1 series · 1 of 1 positions shown · non-contrast
Comparison: No comparison chest x-rays.

CLINICAL DATA: 51-year-old male 1 day post shoulder surgery with
chest pain. Initial encounter.

EXAM:
PORTABLE CHEST 1 VIEW

[portable]
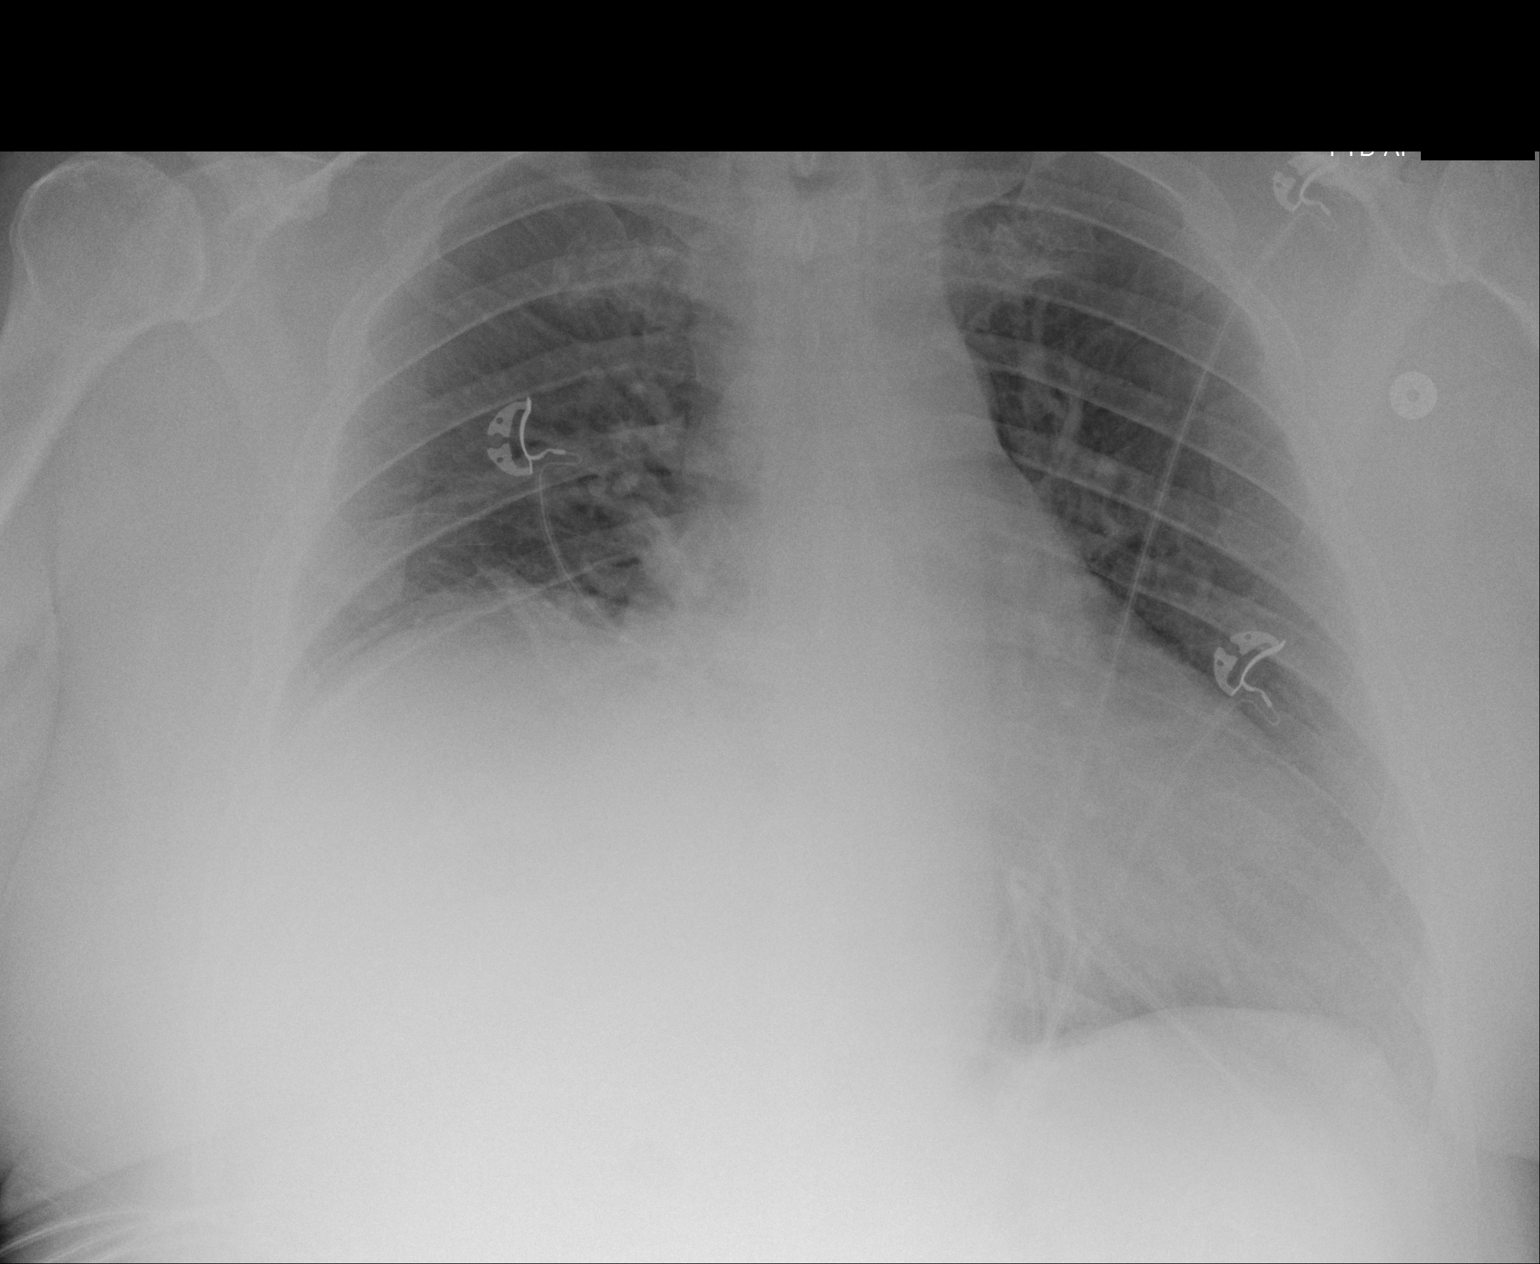

[1 of 1 positions shown; findings below may reference images not displayed]

FINDINGS: Consolidation right mid to lower lobe may represent elevated right
hemidiaphragm with basilar atelectasis. This limits evaluation for
the possibility of underlying mass or pleural effusion.

Cardiomegaly.  Central pulmonary vascular congestion.

No pneumothorax.

Right shoulder joint degenerative changes.
IMPRESSION: Consolidation right mid to lower lobe suggestive of elevated right
hemidiaphragm with basilar atelectasis. This limits evaluation for
detection of underlying mass or pleural effusion.

Cardiomegaly.  Central pulmonary vascular congestion.

## 2019-11-24 ENCOUNTER — Telehealth: Payer: Self-pay | Admitting: Family Medicine

## 2019-11-24 DIAGNOSIS — Z125 Encounter for screening for malignant neoplasm of prostate: Secondary | ICD-10-CM

## 2019-11-24 DIAGNOSIS — I10 Essential (primary) hypertension: Secondary | ICD-10-CM

## 2019-11-24 DIAGNOSIS — Z79899 Other long term (current) drug therapy: Secondary | ICD-10-CM

## 2019-11-24 DIAGNOSIS — E785 Hyperlipidemia, unspecified: Secondary | ICD-10-CM

## 2019-11-24 NOTE — Telephone Encounter (Signed)
Pt has 6 month med check for HTN, hyperlipidemia on 03/10/2020 with Dr. Lovena Le & needs to have [place] order any needed labs  Please advise & call pt when done

## 2019-11-28 NOTE — Telephone Encounter (Signed)
Lab orders placed and pt is aware. Lab orders also mailed to patient.

## 2019-11-28 NOTE — Telephone Encounter (Signed)
Yes, pls order cbc, cmp, lipid panel, and psa.   Thx.   Dr. Darene Lamer

## 2020-01-06 ENCOUNTER — Encounter: Payer: Self-pay | Admitting: Family Medicine

## 2020-01-06 ENCOUNTER — Other Ambulatory Visit: Payer: Self-pay

## 2020-01-06 ENCOUNTER — Ambulatory Visit: Payer: BC Managed Care – PPO | Admitting: Family Medicine

## 2020-01-06 VITALS — HR 80 | Temp 98.9°F | Resp 16

## 2020-01-06 DIAGNOSIS — H6591 Unspecified nonsuppurative otitis media, right ear: Secondary | ICD-10-CM | POA: Diagnosis not present

## 2020-01-06 MED ORDER — AMOXICILLIN-POT CLAVULANATE 875-125 MG PO TABS: 1.0000 | ORAL_TABLET | Freq: Two times a day (BID) | ORAL | 0 refills | Status: AC

## 2020-01-06 NOTE — Progress Notes (Signed)
Patient ID: Devon Mora, male    DOB: 08/17/1965, 54 y.o.   MRN: [id number]   Chief Complaint  Patient presents with  . Dizziness    Patient reports dizziness starting friday night. Today feels "swimmy headed" and wanted to make sure it was nothing else going on.    Subjective:    HPI   Medical History Devon Mora has a past medical history of Allergic rhinitis, CTS (carpal tunnel syndrome), Hyperlipidemia, Hypertension, IFG (impaired fasting glucose), and Obesity.   Outpatient Encounter Medications as of 01/06/2020  Medication Sig  . acetaminophen (TYLENOL) 500 MG tablet Take 1,000 mg by mouth every 6 (six) hours as needed (for pain.).  Marland Kitchen albuterol (PROAIR HFA) 108 (90 Base) MCG/ACT inhaler INHALE TWO PUFFS BY MOUTH EVERY 4 HOURS AS NEEDED FOR WHEEZING  . amoxicillin-clavulanate (AUGMENTIN) 875-125 MG tablet Take 1 tablet by mouth 2 (two) times daily for 7 days.  Marland Kitchen atorvastatin (LIPITOR) 20 MG tablet Take one tablet po qhs  . diazepam (VALIUM) 5 MG tablet Take 1 tablet by mouth every 6 hours as needed for vertigo  . enalapril (VASOTEC) 10 MG tablet Take 1 tablet by mouth once daily  . montelukast (SINGULAIR) 10 MG tablet Take 1 tablet (10 mg total) by mouth at bedtime.  . Multiple Vitamins-Minerals (MULTIVITAMIN WITH MINERALS) tablet Take 1 tablet by mouth daily.   No facility-administered encounter medications on file as of 01/06/2020.     Review of Systems  Constitutional: Negative for chills and fever.  HENT:       History of vertigo. Wears hearing aid in right ear. Issues usually with right ear.  Gastrointestinal: Positive for vomiting.       X 1 on Sat morning     Vitals Pulse 80   Temp 98.9 F (37.2 C)   Resp 16   SpO2 93%   Objective:   Physical Exam Vitals and nursing note reviewed.  Constitutional:      General: He is not in acute distress.    Appearance: He is not ill-appearing or toxic-appearing.  HENT:     Right Ear: Decreased hearing noted. Tympanic  membrane is erythematous.     Left Ear: Tympanic membrane normal.     Ears:     Comments: Wears hearing aid right ear. Fullness noted per patient Cardiovascular:     Rate and Rhythm: Regular rhythm.     Heart sounds: Normal heart sounds.  Pulmonary:     Effort: Pulmonary effort is normal.     Breath sounds: Normal breath sounds.  Neurological:     Mental Status: He is alert.      Assessment and Plan   1. Right non-suppurative otitis media - amoxicillin-clavulanate (AUGMENTIN) 875-125 MG tablet; Take 1 tablet by mouth 2 (two) times daily for 7 days.  Dispense: 14 tablet; Refill: 0   This is a new problem.  Onset: Friday afternoon Location: sinus drainage Duration: since Friday Associated symptoms: dizziness, room spinning, vomited x 1, fullness in right ear Pertinent negatives: no fever, no chest pain, no shortness of breath Alleviating factors: Valium  Aggravating factors: position changes Radiation: n/a Timing: not any particular time of day Treatments: Valium for vertigo Severity: moderate    Will treat ear infection with Augmentin as this has helped in the past with same problem.   Agrees with plan of care discussed today. Understands warning signs to seek further care: increased ear pain, drainage, fever.  Understands to follow-up if symptoms do not improve  or if anything changes. Has appointment with hearing aid doctor soon.    Pecolia Ades, FNP-C

## 2020-01-17 ENCOUNTER — Other Ambulatory Visit: Payer: Self-pay

## 2020-01-17 ENCOUNTER — Other Ambulatory Visit: Payer: Self-pay | Admitting: Internal Medicine

## 2020-01-17 ENCOUNTER — Other Ambulatory Visit: Payer: BC Managed Care – PPO

## 2020-01-17 DIAGNOSIS — Z20822 Contact with and (suspected) exposure to covid-19: Secondary | ICD-10-CM

## 2020-01-18 LAB — NOVEL CORONAVIRUS, NAA: SARS-CoV-2, NAA: NOT DETECTED

## 2020-01-18 LAB — SARS-COV-2, NAA 2 DAY TAT

## 2020-03-06 LAB — PSA: Prostate Specific Ag, Serum: 0.3 ng/mL (ref 0.0–4.0)

## 2020-03-06 LAB — LIPID PANEL
Chol/HDL Ratio: 3.8 ratio (ref 0.0–5.0)
Cholesterol, Total: 127 mg/dL (ref 100–199)
HDL: 33 mg/dL — ABNORMAL LOW (ref >39)
LDL Chol Calc (NIH): 61 mg/dL (ref 0–99)
Triglycerides: 199 mg/dL — ABNORMAL HIGH (ref 0–149)
VLDL Cholesterol Cal: 33 mg/dL (ref 5–40)

## 2020-03-06 LAB — COMPREHENSIVE METABOLIC PANEL
ALT: 29 IU/L (ref 0–44)
AST: 23 IU/L (ref 0–40)
Albumin/Globulin Ratio: 1.8 (ref 1.2–2.2)
Albumin: 4.6 g/dL (ref 3.8–4.9)
Alkaline Phosphatase: 56 IU/L (ref 44–121)
BUN/Creatinine Ratio: 17 (ref 9–20)
BUN: 18 mg/dL (ref 6–24)
Bilirubin Total: 1.1 mg/dL (ref 0.0–1.2)
CO2: 24 mmol/L (ref 20–29)
Calcium: 9.2 mg/dL (ref 8.7–10.2)
Chloride: 103 mmol/L (ref 96–106)
Creatinine, Ser: 1.03 mg/dL (ref 0.76–1.27)
GFR calc Af Amer: 93 mL/min/{1.73_m2} (ref >59)
GFR calc non Af Amer: 81 mL/min/{1.73_m2} (ref >59)
Globulin, Total: 2.5 g/dL (ref 1.5–4.5)
Glucose: 107 mg/dL — ABNORMAL HIGH (ref 65–99)
Potassium: 4.5 mmol/L (ref 3.5–5.2)
Sodium: 140 mmol/L (ref 134–144)
Total Protein: 7.1 g/dL (ref 6.0–8.5)

## 2020-03-06 LAB — CBC WITH DIFFERENTIAL/PLATELET
Basophils Absolute: 0 10*3/uL (ref 0.0–0.2)
Basos: 0 %
EOS (ABSOLUTE): 0.5 10*3/uL — ABNORMAL HIGH (ref 0.0–0.4)
Eos: 6 %
Hematocrit: 43.9 % (ref 37.5–51.0)
Hemoglobin: 15.4 g/dL (ref 13.0–17.7)
Immature Grans (Abs): 0 10*3/uL (ref 0.0–0.1)
Immature Granulocytes: 0 %
Lymphocytes Absolute: 2.6 10*3/uL (ref 0.7–3.1)
Lymphs: 28 %
MCH: 32.4 pg (ref 26.6–33.0)
MCHC: 35.1 g/dL (ref 31.5–35.7)
MCV: 92 fL (ref 79–97)
Monocytes Absolute: 1.1 10*3/uL — ABNORMAL HIGH (ref 0.1–0.9)
Monocytes: 12 %
Neutrophils Absolute: 5.1 10*3/uL (ref 1.4–7.0)
Neutrophils: 54 %
Platelets: 252 10*3/uL (ref 150–450)
RBC: 4.76 x10E6/uL (ref 4.14–5.80)
RDW: 12 % (ref 11.6–15.4)
WBC: 9.3 10*3/uL (ref 3.4–10.8)

## 2020-03-10 ENCOUNTER — Ambulatory Visit (INDEPENDENT_AMBULATORY_CARE_PROVIDER_SITE_OTHER): Payer: BC Managed Care – PPO | Admitting: Family Medicine

## 2020-03-10 ENCOUNTER — Encounter: Payer: Self-pay | Admitting: Family Medicine

## 2020-03-10 VITALS — BP 132/88 | HR 88 | Temp 98.1°F | Ht 69.0 in | Wt 264.4 lb

## 2020-03-10 DIAGNOSIS — E785 Hyperlipidemia, unspecified: Secondary | ICD-10-CM | POA: Diagnosis not present

## 2020-03-10 DIAGNOSIS — R42 Dizziness and giddiness: Secondary | ICD-10-CM

## 2020-03-10 DIAGNOSIS — I10 Essential (primary) hypertension: Secondary | ICD-10-CM | POA: Diagnosis not present

## 2020-03-10 MED ORDER — ENALAPRIL MALEATE 10 MG PO TABS: ORAL_TABLET | ORAL | 1 refills | Status: DC

## 2020-03-10 MED ORDER — DIAZEPAM 5 MG PO TABS: ORAL_TABLET | ORAL | 0 refills | Status: DC

## 2020-03-10 MED ORDER — ATORVASTATIN CALCIUM 20 MG PO TABS: ORAL_TABLET | ORAL | 1 refills | Status: DC

## 2020-03-10 NOTE — Progress Notes (Signed)
Patient ID: Devon Mora, male    DOB: 1965/08/10, 54 y.o.   MRN: [id number]   Chief Complaint  Patient presents with  . Hypertension   Subjective:    HPI Pt here for follow up on HTN. Taking meds as prescribed. Pt walk some and is trying to eat better.  Pt has had vertigo about 4 times this year and is seeing Dr.Teoh.  Pt is seeing counselor for grief (lost son 6 years ago) but patient denies feeling down,depressed or hopeless.   Had vertigo 4x this year.  Had balance test yesterday.  Seeing Dr. Benjamine Mola, ENT. Rt ear warm air in ear.  Sitting still and taking avalium to help.  Worse with laying down at night. Then may vomit after laying down.  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 4 yrs ago.vertigo started round then. And had to get hearing aid 1 yr later due to dec hearing. Saw NP karen for ear infection 2 wks ago.  Labs reviewed.    Medical History Tesean has a past medical history of Allergic rhinitis, CTS (carpal tunnel syndrome), Hyperlipidemia, Hypertension, IFG (impaired fasting glucose), and Obesity.   Outpatient Encounter Medications as of 03/10/2020  Medication Sig  . acetaminophen (TYLENOL) 500 MG tablet Take 1,000 mg by mouth every 6 (six) hours as needed (for pain.).  Marland Kitchen albuterol (PROAIR HFA) 108 (90 Base) MCG/ACT inhaler INHALE TWO PUFFS BY MOUTH EVERY 4 HOURS AS NEEDED FOR WHEEZING  . atorvastatin (LIPITOR) 20 MG tablet Take one tablet po qhs  . diazepam (VALIUM) 5 MG tablet Take 1 tablet by mouth every 6 hours as needed for vertigo  . enalapril (VASOTEC) 10 MG tablet Take 1 tablet by mouth once daily  . montelukast (SINGULAIR) 10 MG tablet Take 1 tablet (10 mg total) by mouth at bedtime.  . Multiple Vitamins-Minerals (MULTIVITAMIN WITH MINERALS) tablet Take 1 tablet by mouth daily.  . [DISCONTINUED] atorvastatin (LIPITOR) 20 MG tablet Take one tablet po qhs  . [DISCONTINUED] diazepam (VALIUM) 5 MG tablet Take 1  tablet by mouth every 6 hours as needed for vertigo  . [DISCONTINUED] enalapril (VASOTEC) 10 MG tablet Take 1 tablet by mouth once daily   No facility-administered encounter medications on file as of 03/10/2020.     Review of Systems  Constitutional: Negative for chills and fever.  HENT: Negative for congestion, rhinorrhea and sore throat.   Respiratory: Negative for cough, shortness of breath and wheezing.   Cardiovascular: Negative for chest pain and leg swelling.  Gastrointestinal: Negative for abdominal pain, diarrhea, nausea and vomiting.  Genitourinary: Negative for dysuria and frequency.  Skin: Negative for rash.  Neurological: Negative for dizziness, weakness and headaches.     Vitals BP 132/88   Pulse 88   Temp 98.1 F (36.7 C)   Ht 5' 9" (1.753 m)   Wt 264 lb 6.4 oz (119.9 kg)   SpO2 97%   BMI 39.05 kg/m   Objective:   Physical Exam Vitals and nursing note reviewed.  Constitutional:      General: He is not in acute distress.    Appearance: Normal appearance. He is not ill-appearing.  HENT:     Head: Normocephalic.     Nose: Nose normal. No congestion.     Mouth/Throat:     Mouth: Mucous membranes are moist.     Pharynx: No oropharyngeal exudate.  Eyes:     Extraocular Movements: Extraocular movements intact.  Conjunctiva/sclera: Conjunctivae normal.     Pupils: Pupils are equal, round, and reactive to light.  Cardiovascular:     Rate and Rhythm: Normal rate and regular rhythm.     Pulses: Normal pulses.     Heart sounds: Normal heart sounds. No murmur heard.   Pulmonary:     Effort: Pulmonary effort is normal.     Breath sounds: Normal breath sounds. No wheezing, rhonchi or rales.  Musculoskeletal:        General: Normal range of motion.     Right lower leg: No edema.     Left lower leg: No edema.  Skin:    General: Skin is warm and dry.     Findings: No rash.  Neurological:     General: No focal deficit present.     Mental Status: He is  alert and oriented to person, place, and time.     Cranial Nerves: No cranial nerve deficit.  Psychiatric:        Mood and Affect: Mood normal.        Behavior: Behavior normal.        Thought Content: Thought content normal.        Judgment: Judgment normal.      Assessment and Plan   1. Hypertension, unspecified type - CBC; Future - CMP14+EGFR; Future - Lipid panel; Future  2. Hyperlipidemia, unspecified hyperlipidemia type - CBC; Future - CMP14+EGFR; Future - Lipid panel; Future  3. Vertigo   HLD- stable, cont meds. Dec carbs in diet.  Vertigo-stable.  Needing refill on valium for his vertigo flares.  Pt f/u with Dr. Benjamine Mola ENT on his balance studies, next week. Call if worsening ear pain/vertigo.  htn- suboptimal today.   Running 120/-70-80s at home. Last few were stable.  Will cont to monitor.   Pt to watch salt in diet.  Labs 1 wk prior to appt.   F/u 39mo

## 2020-03-17 ENCOUNTER — Other Ambulatory Visit: Payer: Self-pay | Admitting: *Deleted

## 2020-03-17 MED ORDER — MONTELUKAST SODIUM 10 MG PO TABS: 10.0000 mg | ORAL_TABLET | Freq: Every day | ORAL | 0 refills | Status: DC

## 2020-06-13 ENCOUNTER — Other Ambulatory Visit: Payer: Self-pay | Admitting: Family Medicine

## 2020-09-07 ENCOUNTER — Telehealth: Payer: BC Managed Care – PPO | Admitting: Family Medicine

## 2020-09-07 ENCOUNTER — Other Ambulatory Visit: Payer: Self-pay

## 2020-09-07 DIAGNOSIS — Z634 Disappearance and death of family member: Secondary | ICD-10-CM

## 2020-09-07 DIAGNOSIS — R42 Dizziness and giddiness: Secondary | ICD-10-CM | POA: Diagnosis not present

## 2020-09-07 DIAGNOSIS — E785 Hyperlipidemia, unspecified: Secondary | ICD-10-CM | POA: Diagnosis not present

## 2020-09-07 DIAGNOSIS — I10 Essential (primary) hypertension: Secondary | ICD-10-CM | POA: Diagnosis not present

## 2020-09-07 DIAGNOSIS — F4321 Adjustment disorder with depressed mood: Secondary | ICD-10-CM

## 2020-09-07 MED ORDER — ENALAPRIL MALEATE 10 MG PO TABS
ORAL_TABLET | ORAL | 1 refills | Status: DC
Start: 2020-09-07 — End: 2021-03-02

## 2020-09-07 MED ORDER — DIAZEPAM 5 MG PO TABS
ORAL_TABLET | ORAL | 0 refills | Status: DC
Start: 2020-09-07 — End: 2021-09-26

## 2020-09-07 MED ORDER — ATORVASTATIN CALCIUM 20 MG PO TABS: ORAL_TABLET | ORAL | 1 refills | Status: DC

## 2020-09-07 NOTE — Progress Notes (Signed)
Patient ID: Devon Mora, male    DOB: Jan 24, 1966, 55 y.o.   MRN: [id number]   Virtual Visit via Telephone Note  I connected with Devon Mora on 09/07/20 at 10:40 AM EDT by telephone and verified that I am speaking with the correct person using two identifiers.  Location: Patient: home Provider: office   I discussed the limitations, risks, security and privacy concerns of performing an evaluation and management service by telephone and the availability of in person appointments. I also discussed with the patient that there may be a patient responsible charge related to this service. The patient expressed understanding and agreed to proceed.  Chief Complaint  Patient presents with  . Hypertension  . Dizziness    H/o vertigo   Subjective:    HPI   F/u htn, vertigo, hld- - bp yesterday was 139/78. Pt states he checks at home from time to time and runs pretty good.   Would like to get valium refilled. Takes for vertigo. Most of the time just needs when on vacation. Did have an epidsode the other day and took a sudafed pm and it made his bp go up.   Had sudafed and took it for vertigo and increases the bp. Seen with Dr. Benjamine Mora, ENT.  Has severe vertigo and was told by ENT, nothing to give to fix it or a pill for it.  Just manage the vertigo.  When getting a sinus issue then has it flare up of vertigo. Had MRI and vestibular testing, had both peripheral and central vestibulr dysfunction.  Lost his son 7 yrs ago, has some anxiety with this.  HLD- doing well no new concerns.  Compliant with meds. No chest pain, palpitations, myalgias or joint pains.  HTN Pt compliant with BP meds.  No SEs Denies chest pain, sob, LE swelling, or blurry vision.   Medical History Devon Mora has a past medical history of Allergic rhinitis, CTS (carpal tunnel syndrome), Hyperlipidemia, Hypertension, IFG (impaired fasting glucose), and Obesity.   Outpatient Encounter Medications as of 09/07/2020   Medication Sig  . acetaminophen (TYLENOL) 500 MG tablet Take 1,000 mg by mouth every 6 (six) hours as needed (for pain.).  Marland Kitchen albuterol (PROAIR HFA) 108 (90 Base) MCG/ACT inhaler INHALE TWO PUFFS BY MOUTH EVERY 4 HOURS AS NEEDED FOR WHEEZING  . montelukast (SINGULAIR) 10 MG tablet TAKE 1 TABLET BY MOUTH AT BEDTIME  . Multiple Vitamins-Minerals (MULTIVITAMIN WITH MINERALS) tablet Take 1 tablet by mouth daily.  Marland Kitchen triamcinolone cream (KENALOG) 0.1 % Apply topically.  . [DISCONTINUED] atorvastatin (LIPITOR) 20 MG tablet Take one tablet po qhs  . [DISCONTINUED] diazepam (VALIUM) 5 MG tablet Take 1 tablet by mouth every 6 hours as needed for vertigo  . [DISCONTINUED] enalapril (VASOTEC) 10 MG tablet Take 1 tablet by mouth once daily  . atorvastatin (LIPITOR) 20 MG tablet Take one tablet po qhs  . diazepam (VALIUM) 5 MG tablet Take 1 tablet by mouth every 6 hours as needed for vertigo  . enalapril (VASOTEC) 10 MG tablet Take 1 tablet by mouth once daily   No facility-administered encounter medications on file as of 09/07/2020.     Review of Systems  Constitutional: Negative for chills and fever.  HENT: Negative for congestion, rhinorrhea and sore throat.   Respiratory: Negative for cough, shortness of breath and wheezing.   Cardiovascular: Negative for chest pain and leg swelling.  Gastrointestinal: Negative for abdominal pain, diarrhea, nausea and vomiting.  Genitourinary: Negative for dysuria and frequency.  Skin: Negative for rash.  Neurological: Positive for dizziness (h/o vertigo -intermittent). Negative for weakness and headaches.     Vitals There were no vitals taken for this visit.  Objective:   Physical Exam  No PE due to phone visit.  Assessment and Plan   1. Vertigo - diazepam (VALIUM) 5 MG tablet; Take 1 tablet by mouth every 6 hours as needed for vertigo  Dispense: 24 tablet; Refill: 0  2. Hypertension, unspecified type - CMP14+EGFR - enalapril (VASOTEC) 10 MG  tablet; Take 1 tablet by mouth once daily  Dispense: 90 tablet; Refill: 1  3. Hyperlipidemia, unspecified hyperlipidemia type - Lipid panel - atorvastatin (LIPITOR) 20 MG tablet; Take one tablet po qhs  Dispense: 90 tablet; Refill: 1  4. Grief at loss of child   Sinus flares/vertigo- Recommending coricidin hbp instead of sudafed. Reviewed that sufafed will increase his bp. For his allergies or sinus issues.  refilled the valium prn.  htn- stable. Cont meds. Cont to check at home and call if seeing over 145/85 or above.  hld- stable recheck labs, cont meds.    Return in about 6 months (around 03/10/2021) for f/u htn, hld.  09/20/2020     Follow Up Instructions:    I discussed the assessment and treatment plan with the patient. The patient was provided an opportunity to ask questions and all were answered. The patient agreed with the plan and demonstrated an understanding of the instructions.   The patient was advised to call back or seek an in-person evaluation if the symptoms worsen or if the condition fails to improve as anticipated.  I provided 15 minutes of non-face-to-face time during this encounter.

## 2020-09-15 LAB — CMP14+EGFR
ALT: 28 IU/L (ref 0–44)
AST: 19 IU/L (ref 0–40)
Albumin/Globulin Ratio: 1.8 (ref 1.2–2.2)
Albumin: 4.6 g/dL (ref 3.8–4.9)
Alkaline Phosphatase: 56 IU/L (ref 44–121)
BUN/Creatinine Ratio: 12 (ref 9–20)
BUN: 14 mg/dL (ref 6–24)
Bilirubin Total: 1.3 mg/dL — ABNORMAL HIGH (ref 0.0–1.2)
CO2: 25 mmol/L (ref 20–29)
Calcium: 9.4 mg/dL (ref 8.7–10.2)
Chloride: 101 mmol/L (ref 96–106)
Creatinine, Ser: 1.15 mg/dL (ref 0.76–1.27)
Globulin, Total: 2.6 g/dL (ref 1.5–4.5)
Glucose: 106 mg/dL — ABNORMAL HIGH (ref 65–99)
Potassium: 4.6 mmol/L (ref 3.5–5.2)
Sodium: 139 mmol/L (ref 134–144)
Total Protein: 7.2 g/dL (ref 6.0–8.5)
eGFR: 75 mL/min/{1.73_m2} (ref >59)

## 2020-09-15 LAB — LIPID PANEL
Chol/HDL Ratio: 3.4 ratio (ref 0.0–5.0)
Cholesterol, Total: 138 mg/dL (ref 100–199)
HDL: 41 mg/dL (ref >39)
LDL Chol Calc (NIH): 70 mg/dL (ref 0–99)
Triglycerides: 158 mg/dL — ABNORMAL HIGH (ref 0–149)
VLDL Cholesterol Cal: 27 mg/dL (ref 5–40)

## 2020-09-20 ENCOUNTER — Encounter: Payer: Self-pay | Admitting: Family Medicine

## 2020-09-21 ENCOUNTER — Telehealth: Payer: Self-pay | Admitting: *Deleted

## 2020-09-21 NOTE — Telephone Encounter (Signed)
Called pt about bw results and he just wanted to let dr taylor know he had another epidsode of vertigo this morning. Better now. States he has talked to you in the past about it. Doesn't need anything at this time. Told to call back if he does. States it has been going on for years off and on and just letting dr taylor know.

## 2020-10-07 NOTE — Telephone Encounter (Signed)
error 

## 2021-03-01 ENCOUNTER — Other Ambulatory Visit: Payer: Self-pay | Admitting: Family Medicine

## 2021-03-01 DIAGNOSIS — I10 Essential (primary) hypertension: Secondary | ICD-10-CM

## 2021-03-01 DIAGNOSIS — E785 Hyperlipidemia, unspecified: Secondary | ICD-10-CM

## 2021-03-03 ENCOUNTER — Other Ambulatory Visit: Payer: Self-pay | Admitting: Family Medicine

## 2021-03-03 DIAGNOSIS — E785 Hyperlipidemia, unspecified: Secondary | ICD-10-CM

## 2021-03-03 DIAGNOSIS — I10 Essential (primary) hypertension: Secondary | ICD-10-CM

## 2021-05-28 ENCOUNTER — Other Ambulatory Visit: Payer: Self-pay | Admitting: Family Medicine

## 2021-05-28 DIAGNOSIS — I10 Essential (primary) hypertension: Secondary | ICD-10-CM

## 2021-05-28 DIAGNOSIS — E785 Hyperlipidemia, unspecified: Secondary | ICD-10-CM

## 2021-06-18 ENCOUNTER — Other Ambulatory Visit: Payer: Self-pay

## 2021-06-18 ENCOUNTER — Encounter (HOSPITAL_COMMUNITY): Payer: Self-pay

## 2021-06-18 ENCOUNTER — Observation Stay (HOSPITAL_COMMUNITY)
Admission: EM | Admit: 2021-06-18 | Discharge: 2021-06-19 | Disposition: A | Discharge status: Home / Self Care | Payer: BC Managed Care – PPO | Attending: Internal Medicine | Admitting: Internal Medicine

## 2021-06-18 ENCOUNTER — Emergency Department (HOSPITAL_COMMUNITY): Payer: BC Managed Care – PPO

## 2021-06-18 DIAGNOSIS — E871 Hypo-osmolality and hyponatremia: Secondary | ICD-10-CM | POA: Diagnosis not present

## 2021-06-18 DIAGNOSIS — A403 Sepsis due to Streptococcus pneumoniae: Secondary | ICD-10-CM | POA: Diagnosis not present

## 2021-06-18 DIAGNOSIS — I10 Essential (primary) hypertension: Secondary | ICD-10-CM | POA: Insufficient documentation

## 2021-06-18 DIAGNOSIS — R0602 Shortness of breath: Secondary | ICD-10-CM

## 2021-06-18 DIAGNOSIS — R059 Cough, unspecified: Secondary | ICD-10-CM

## 2021-06-18 DIAGNOSIS — Z20822 Contact with and (suspected) exposure to covid-19: Secondary | ICD-10-CM | POA: Insufficient documentation

## 2021-06-18 DIAGNOSIS — Z79899 Other long term (current) drug therapy: Secondary | ICD-10-CM | POA: Insufficient documentation

## 2021-06-18 DIAGNOSIS — A419 Sepsis, unspecified organism: Secondary | ICD-10-CM | POA: Diagnosis not present

## 2021-06-18 LAB — CBC WITH DIFFERENTIAL/PLATELET
Abs Immature Granulocytes: 0.15 10*3/uL — ABNORMAL HIGH (ref 0.00–0.07)
Basophils Absolute: 0.1 10*3/uL (ref 0.0–0.1)
Basophils Relative: 0 %
Eosinophils Absolute: 0 10*3/uL (ref 0.0–0.5)
Eosinophils Relative: 0 %
HCT: 40.1 % (ref 39.0–52.0)
Hemoglobin: 14.1 g/dL (ref 13.0–17.0)
Immature Granulocytes: 1 %
Lymphocytes Relative: 6 %
Lymphs Abs: 1.3 10*3/uL (ref 0.7–4.0)
MCH: 31.8 pg (ref 26.0–34.0)
MCHC: 35.2 g/dL (ref 30.0–36.0)
MCV: 90.3 fL (ref 80.0–100.0)
Monocytes Absolute: 2.1 10*3/uL — ABNORMAL HIGH (ref 0.1–1.0)
Monocytes Relative: 10 %
Neutro Abs: 17 10*3/uL — ABNORMAL HIGH (ref 1.7–7.7)
Neutrophils Relative %: 83 %
Platelets: 216 10*3/uL (ref 150–400)
RBC: 4.44 MIL/uL (ref 4.22–5.81)
RDW: 11.7 % (ref 11.5–15.5)
WBC: 20.6 10*3/uL — ABNORMAL HIGH (ref 4.0–10.5)
nRBC: 0 % (ref 0.0–0.2)

## 2021-06-18 LAB — BASIC METABOLIC PANEL
Anion gap: 9 (ref 5–15)
BUN: 14 mg/dL (ref 6–20)
CO2: 24 mmol/L (ref 22–32)
Calcium: 8.6 mg/dL — ABNORMAL LOW (ref 8.9–10.3)
Chloride: 96 mmol/L — ABNORMAL LOW (ref 98–111)
Creatinine, Ser: 1.02 mg/dL (ref 0.61–1.24)
GFR, Estimated: >60 mL/min (ref >60)
Glucose, Bld: 146 mg/dL — ABNORMAL HIGH (ref 70–99)
Potassium: 4 mmol/L (ref 3.5–5.1)
Sodium: 129 mmol/L — ABNORMAL LOW (ref 135–145)

## 2021-06-18 LAB — HIV ANTIBODY (ROUTINE TESTING W REFLEX): HIV Screen 4th Generation wRfx: NONREACTIVE

## 2021-06-18 LAB — LACTIC ACID, PLASMA
Lactic Acid, Venous: 1 mmol/L (ref 0.5–1.9)
Lactic Acid, Venous: 1.5 mmol/L (ref 0.5–1.9)

## 2021-06-18 LAB — RESP PANEL BY RT-PCR (FLU A&B, COVID) ARPGX2
Influenza A by PCR: NEGATIVE
Influenza B by PCR: NEGATIVE
SARS Coronavirus 2 by RT PCR: NEGATIVE

## 2021-06-18 MED ORDER — ACETAMINOPHEN 325 MG PO TABS
650.0000 mg | ORAL_TABLET | Freq: Four times a day (QID) | ORAL | Status: DC | PRN
Start: 2021-06-18 — End: 2021-06-19
  Administered 2021-06-18: 650 mg via ORAL
  Filled 2021-06-18: qty 2

## 2021-06-18 MED ORDER — ATORVASTATIN CALCIUM 10 MG PO TABS
20.0000 mg | ORAL_TABLET | Freq: Every day | ORAL | Status: DC
  Administered 2021-06-18: 20 mg via ORAL
  Filled 2021-06-18: qty 2

## 2021-06-18 MED ORDER — ENOXAPARIN SODIUM 60 MG/0.6ML IJ SOSY
60.0000 mg | PREFILLED_SYRINGE | Freq: Every day | INTRAMUSCULAR | Status: DC
  Filled 2021-06-18 (×2): qty 0.6

## 2021-06-18 MED ORDER — ENALAPRIL MALEATE 5 MG PO TABS
10.0000 mg | ORAL_TABLET | Freq: Every day | ORAL | Status: DC
  Administered 2021-06-18: 10 mg via ORAL
  Filled 2021-06-18 (×2): qty 2

## 2021-06-18 MED ORDER — ACETAMINOPHEN 325 MG PO TABS
650.0000 mg | ORAL_TABLET | Freq: Once | ORAL | Status: AC
  Administered 2021-06-18: 650 mg via ORAL
  Filled 2021-06-18: qty 2

## 2021-06-18 MED ORDER — ONDANSETRON HCL 4 MG PO TABS: 4.0000 mg | ORAL_TABLET | Freq: Four times a day (QID) | ORAL | Status: DC | PRN

## 2021-06-18 MED ORDER — LACTATED RINGERS IV BOLUS (SEPSIS)
500.0000 mL | Freq: Once | INTRAVENOUS | Status: AC
  Administered 2021-06-18: 500 mL via INTRAVENOUS

## 2021-06-18 MED ORDER — ONDANSETRON HCL 4 MG/2ML IJ SOLN
4.0000 mg | Freq: Four times a day (QID) | INTRAMUSCULAR | Status: DC | PRN
Start: 2021-06-18 — End: 2021-06-19

## 2021-06-18 MED ORDER — FLUTICASONE PROPIONATE 50 MCG/ACT NA SUSP
2.0000 | Freq: Every day | NASAL | Status: DC
  Administered 2021-06-18 – 2021-06-19 (×2): 2 via NASAL
  Filled 2021-06-18: qty 16

## 2021-06-18 MED ORDER — LACTATED RINGERS IV BOLUS (SEPSIS)
1000.0000 mL | Freq: Once | INTRAVENOUS | Status: AC
  Administered 2021-06-18: 1000 mL via INTRAVENOUS

## 2021-06-18 MED ORDER — SODIUM CHLORIDE 0.9 % IV SOLN
500.0000 mg | INTRAVENOUS | Status: DC
  Administered 2021-06-18 – 2021-06-19 (×2): 500 mg via INTRAVENOUS
  Filled 2021-06-18 (×2): qty 5

## 2021-06-18 MED ORDER — LACTATED RINGERS IV SOLN: INTRAVENOUS | Status: DC

## 2021-06-18 MED ORDER — SODIUM CHLORIDE 0.9 % IV SOLN: INTRAVENOUS | Status: DC

## 2021-06-18 MED ORDER — ACETAMINOPHEN 650 MG RE SUPP: 650.0000 mg | Freq: Four times a day (QID) | RECTAL | Status: DC | PRN

## 2021-06-18 MED ORDER — ALBUTEROL SULFATE (2.5 MG/3ML) 0.083% IN NEBU: 3.0000 mL | INHALATION_SOLUTION | RESPIRATORY_TRACT | Status: DC | PRN

## 2021-06-18 MED ORDER — SODIUM CHLORIDE 0.9 % IV SOLN
2.0000 g | INTRAVENOUS | Status: DC
  Administered 2021-06-18 – 2021-06-19 (×2): 2 g via INTRAVENOUS
  Filled 2021-06-18 (×2): qty 20

## 2021-06-18 NOTE — Progress Notes (Signed)
Elink following code sepsis °

## 2021-06-18 NOTE — Progress Notes (Signed)
PHARMACIST - PHYSICIAN COMMUNICATION  CONCERNING:  Enoxaparin (Lovenox) for DVT Prophylaxis    RECOMMENDATION: Patient was prescribed enoxaprin 40mg  q24 hours for VTE prophylaxis.   Filed Weights   06/18/21 0841 06/18/21 1400  Weight: 113.4 kg (250 lb) 123 kg (271 lb 2.7 oz)    Body mass index is 40.04 kg/m.  Estimated Creatinine Clearance: 103.5 mL/min (by C-G formula based on SCr of 1.02 mg/dL).   Based on [place] patient is candidate for enoxaparin 0.5mg /kg TBW SQ every 24 hours based on BMI being >30.  DESCRIPTION: Pharmacy has adjusted enoxaparin dose per [facility] policy.  Patient is now receiving enoxaparin 60 mg every 24 hours    Ena Dawley, PharmD Clinical Pharmacist  06/18/2021 2:16 PM

## 2021-06-18 NOTE — ED Provider Notes (Signed)
[facility] EMERGENCY DEPARTMENT Provider Note   CSN: [id number] Arrival date & time: 06/18/21  4098     History Chief Complaint  Patient presents with   Shortness of Breath    Nasal congestion     SHARRON SIMPSON is a 56 y.o. male with history of hypertension and hyperlipidemia who presents to the emergency department with 1 week history of productive cough with yellow sputum.  He states that over the last week patient started with left sided nasal congestion, general malaise, generalized weakness.  He states that as we progressed congestion began settling in his chest which prompted the productive cough.  He does report 1 episode of vomiting.  Intermittent subjective fever at home.  He denies abdominal pain, chest pain, or diarrhea.  Patient has been taking NyQuil and Mucinex with little improvement.  Patient does endorse mild shortness of breath.   Shortness of Breath     Home Medications Prior to Admission medications   Medication Sig Start Date End Date Taking? Authorizing Provider  acetaminophen (TYLENOL) 500 MG tablet Take 1,000 mg by mouth every 6 (six) hours as needed (for pain.).    [provider]  albuterol (PROAIR HFA) 108 (90 Base) MCG/ACT inhaler INHALE TWO PUFFS BY MOUTH EVERY 4 HOURS AS NEEDED FOR WHEEZING 07/23/19   Mikey Kirschner, MD  atorvastatin (LIPITOR) 20 MG tablet TAKE 1 TABLET BY MOUTH ONCE DAILY AT BEDTIME 05/30/21   Cook, Jayce G, DO  diazepam (VALIUM) 5 MG tablet Take 1 tablet by mouth every 6 hours as needed for vertigo 09/07/20   Elvia Collum M, DO  enalapril (VASOTEC) 10 MG tablet Take 1 tablet by mouth once daily 05/30/21   Cook, Jayce G, DO  montelukast (SINGULAIR) 10 MG tablet TAKE 1 TABLET BY MOUTH AT BEDTIME 06/16/20   Elvia Collum M, DO  Multiple Vitamins-Minerals (MULTIVITAMIN WITH MINERALS) tablet Take 1 tablet by mouth daily.    [provider]  triamcinolone cream (KENALOG) 0.1 % Apply topically. 08/25/20   [provider]      Allergies    Dust mite extract, Phentermine, and Pollen extract    Review of Systems   Review of Systems  Respiratory:  Positive for shortness of breath.   All other systems reviewed and are negative.  Physical Exam Updated Vital Signs BP 140/89    Pulse 97    Temp (!) 100.9 F (38.3 C) (Oral)    Resp 19    Ht 5\' 9"  (1.753 m)    Wt 113.4 kg    SpO2 95%    BMI 36.92 kg/m  Physical Exam Vitals and nursing note reviewed.  Constitutional:      General: He is in acute distress.     Appearance: Normal appearance.  HENT:     Head: Normocephalic and atraumatic.  Eyes:     General:        Right eye: No discharge.        Left eye: No discharge.  Cardiovascular:     Comments: Tachycardic with regular rhythm.  S1/S2 are distinct without any evidence of murmur, rubs, or gallops.  Radial pulses are 2+ bilaterally.  Dorsalis pedis pulses are 2+ bilaterally.  No evidence of pedal edema. Pulmonary:     Comments: Decreased breath sounds in the right lower lung field with overlying mild rhonchi.  Rest of the lung fields are clear to auscultation.  Patient is tachypneic.  Mild respiratory distress. Abdominal:     General:  Abdomen is flat. Bowel sounds are normal. There is no distension.     Tenderness: There is no abdominal tenderness. There is no guarding or rebound.  Musculoskeletal:        General: Normal range of motion.     Cervical back: Neck supple.  Skin:    General: Skin is warm and dry.     Findings: No rash.  Neurological:     General: No focal deficit present.     Mental Status: He is alert.  Psychiatric:        Mood and Affect: Mood normal.        Behavior: Behavior normal.    ED Results / Procedures / Treatments   Labs (all labs ordered are listed, but only abnormal results are displayed) Labs Reviewed  CBC WITH DIFFERENTIAL/PLATELET - Abnormal; Notable for the following components:      Result Value   WBC 20.6 (*)    Neutro Abs 17.0 (*)     Monocytes Absolute 2.1 (*)    Abs Immature Granulocytes 0.15 (*)    All other components within normal limits  BASIC METABOLIC PANEL - Abnormal; Notable for the following components:   Sodium 129 (*)    Chloride 96 (*)    Glucose, Bld 146 (*)    Calcium 8.6 (*)    All other components within normal limits  RESP PANEL BY RT-PCR (FLU A&B, COVID) ARPGX2  CULTURE, BLOOD (ROUTINE X 2)  CULTURE, BLOOD (ROUTINE X 2)  LACTIC ACID, PLASMA  LACTIC ACID, PLASMA    EKG None  Radiology DG Chest 2 View  Result Date: 06/18/2021 CLINICAL DATA:  Cough and shortness of breath EXAM: CHEST - 2 VIEW COMPARISON:  10/23/2017 FINDINGS: Infiltrate in the right lower lobe overlapping the lower hilum on the frontal view but more posterior on the lateral view. No edema, effusion, or pneumothorax. Chronic cardiac enlargement and elevated right diaphragm. IMPRESSION: Right lower lobe pneumonia. Followup PA and lateral chest X-ray is recommended in 3-4 weeks following trial of antibiotic therapy to ensure resolution. Electronically Signed   By: Jorje Guild M.D.   On: 06/18/2021 09:15    Procedures .Critical Care Performed by: Hendricks Limes, PA-C Authorized by: Hendricks Limes, PA-C   Critical care provider statement:    Critical care time (minutes):  35   Critical care time was exclusive of:  Separately billable procedures and treating other patients   Critical care was necessary to treat or prevent imminent or life-threatening deterioration of the following conditions:  Respiratory failure and sepsis   Critical care was time spent personally by me on the following activities:  Ordering and performing treatments and interventions, ordering and review of laboratory studies, ordering and review of radiographic studies, re-evaluation of patient's condition, review of old charts and examination of patient   I assumed direction of critical care for this patient from another provider in my specialty: no      Care discussed with: admitting provider      Medications Ordered in ED Medications  lactated ringers infusion (has no administration in time range)  cefTRIAXone (ROCEPHIN) 2 g in sodium chloride 0.9 % 100 mL IVPB (0 g Intravenous Stopped 06/18/21 1047)  azithromycin (ZITHROMAX) 500 mg in sodium chloride 0.9 % 250 mL IVPB (500 mg Intravenous New Bag/Given 06/18/21 1047)  lactated ringers bolus 1,000 mL (1,000 mLs Intravenous New Bag/Given 06/18/21 1027)    And  lactated ringers bolus 1,000 mL (1,000 mLs Intravenous New Bag/Given 06/18/21  1028)    And  lactated ringers bolus 1,000 mL (1,000 mLs Intravenous New Bag/Given 06/18/21 1027)    And  lactated ringers bolus 500 mL (500 mLs Intravenous New Bag/Given 06/18/21 1028)  acetaminophen (TYLENOL) tablet 650 mg (650 mg Oral Given 06/18/21 1106)    ED Course/ Medical Decision Making/ A&P Clinical Course as of 06/18/21 1253  Sat Jun 18, 2021  1054 I discussed this case with my attending physician who cosigned this note including patient's presenting symptoms, physical exam, and planned diagnostics and interventions. Attending physician stated agreement with plan or made changes to plan which were implemented.      [CF]    Clinical Course User Index [CF] Hendricks Limes, PA-C                           Medical Decision Making Amount and/or Complexity of Data Reviewed Labs: ordered. Radiology: ordered.  Risk OTC drugs. Prescription drug management.   This patient presents to the ED for concern of productive cough and shortness of breath, this involves an extensive number of treatment options, and is a complaint that carries with it a high risk of complications and morbidity.  The differential diagnosis includes pneumonia with subsequent sepsis, viral URI.  I have a low suspicion for pulmonary embolism, ACS.   Co morbidities that complicate the patient evaluation  Hypertension Hyperlipidemia   Additional history  obtained:  Additional history obtained from nursing note and old records External records from outside source obtained and reviewed including primary care office visits.  Patient's hyperlipidemia seems to be well controlled.   Lab Tests:  I Ordered, and personally interpreted labs.  The pertinent results include: CBC which revealed significant leukocytosis.  BMP showed hyponatremia, hypochloremia, and elevated glucose.  Initial lactic was normal.  Secondary lactic was normal.  Cultures were drawn and pending prior to antibiotics.  COVID and flu were normal.   Imaging Studies ordered:  I ordered imaging studies including  I independently visualized and interpreted imaging which showed chest x-ray which showed right lower lobe pneumonia. I agree with the radiologist interpretation   Cardiac Monitoring:  The patient was maintained on a cardiac monitor.  I personally viewed and interpreted the cardiac monitored which showed an underlying rhythm of: Tachycardia with normal rhythm   Medicines ordered and prescription drug management:  I ordered medication including lactate fluid bolus for sepsis protocol.  Tylenol for fever.  Ceftriaxone and azithromycin for pneumonia and sepsis. Reevaluation of the patient after these medicines showed that the patient improved I have reviewed the patients home medicines and have made adjustments as needed.  Critical Interventions:  Aggressive fluid resuscitation for tachycardia and tachypnea per sepsis protocol Aggressive parenteral antibiotics for sepsis secondary to pneumonia   Consultations Obtained:  I requested consultation with the Dr. Manuella Ghazi with [facility] hospitalist,  and discussed lab and imaging findings as well as pertinent plan - they recommend: Admission   Problem List / ED Course:  Sepsis secondary to pneumonia.  Patient arrived in acute distress was tachycardic and tachypneic.  Initial work-up was ordered in triage.  Code sepsis was  activated by myself immediately after evaluating the patient and sepsis order set was initiated.  I immediately started him on lactate Ringer's fluid resuscitation and IV antibiotics for pneumonia.  His tachycardia and tachypnea improved however, patient then spiked a fever.  Tylenol was ordered for fever.  Patient has been oxygenating well on room air between  95-100%.  Given the clinical scenario, patient needs to come in for sepsis secondary to pneumonia.  This patient required numerous reevaluations and complex medical management.   Reevaluation:  After the interventions noted above, I reevaluated the patient and found that they have :improved   Dispostion:  After consideration of the diagnostic results and the patients response to treatment, I feel that the patent would benefit from admission in the hospital secondary to sepsis from pneumonia.  I discussed the results with the patient and significant other at bedside.  All questions and concerns addressed.  Patient was amenable to this plan.  I spoke with Dr. Manuella Ghazi with [facility] hospitalist who agrees to admit the patient.  Final Clinical Impression(s) / ED Diagnoses Final diagnoses:  Sepsis due to pneumonia [facility])    Rx / [place] Orders ED Discharge Orders     None         Cherrie Gauze 06/18/21 1253    Milton Ferguson, MD 06/20/21 [phone]

## 2021-06-18 NOTE — ED Triage Notes (Signed)
Reports sob and nasal congestion x 1 week with low grade fever. 2 negative covid home test.  Reports coughing up yellow phlegm.

## 2021-06-18 NOTE — H&P (Addendum)
History and Physical    Devon Mora TML:[id number] DOB: 08-20-1965 DOA: 06/18/2021  PCP: Coral Spikes, DO   Patient coming from: Home  Chief Complaint: Cough/SOB  HPI: Devon Mora is a 56 y.o. male with medical history significant for obesity, hypertension, dyslipidemia, and allergic rhinitis who presented to the ED with history of cough productive of yellow sputum that started approximately 1 week ago.  This initially began with some nasal congestion and then progressed to chest congestion with general malaise and weakness.  He he does endorse some vomiting as well as some nausea last night with intermittent fevers at home.  He has been taking some NyQuil and Mucinex with no improvement.  He is also struggling with some chest tightness and shortness of breath.   ED Course: Vital signs with some initial tachycardia and tachypnea noted as well as temperature 100.9 F.  He is noted to have leukocytosis of 20,600.  No significant lactic acidosis noted and sodium levels 129.  Chest x-ray with right lower lobe pneumonia noted and COVID testing as well as flu testing is negative.  He has been started on some azithromycin and Rocephin in the ED and given IV fluid.  Review of Systems: Reviewed as noted above, otherwise negative.  Past Medical History:  Diagnosis Date   Allergic rhinitis    CTS (carpal tunnel syndrome)    Hyperlipidemia    Hypertension    IFG (impaired fasting glucose)    Obesity     Past Surgical History:  Procedure Laterality Date   COLONOSCOPY N/A 03/18/2018   Procedure: COLONOSCOPY;  Surgeon: Rogene [place], MD;  [facility]: [facility];  Service: Endoscopy;  Laterality: N/A;  830   POLYPECTOMY  03/18/2018   Procedure: POLYPECTOMY;  Surgeon: Rogene [place], MD;  Location: [facility];  Service: Endoscopy;;  colon   right rotator cuff     VASECTOMY       reports that he has never smoked. He has never used smokeless tobacco. He reports that he does not drink  alcohol and does not use drugs.  Allergies  Allergen Reactions   Dust Mite Extract Other (See Comments)    Chest tightness/runny nose/sneezing   Phentermine     GI symptoms   Pollen Extract Other (See Comments)    Chest tightness/runny nose/sneezing     Family History  Problem Relation Age of Onset   Hypertension Mother    Cancer Father        Renal cell    Prior to Admission medications   Medication Sig Start Date End Date Taking? Authorizing Provider  acetaminophen (TYLENOL) 500 MG tablet Take 1,000 mg by mouth every 6 (six) hours as needed (for pain.).    [provider]  albuterol (PROAIR HFA) 108 (90 Base) MCG/ACT inhaler INHALE TWO PUFFS BY MOUTH EVERY 4 HOURS AS NEEDED FOR WHEEZING 07/23/19   Mikey Kirschner, MD  atorvastatin (LIPITOR) 20 MG tablet TAKE 1 TABLET BY MOUTH ONCE DAILY AT BEDTIME 05/30/21   Cook, Jayce G, DO  diazepam (VALIUM) 5 MG tablet Take 1 tablet by mouth every 6 hours as needed for vertigo 09/07/20   Elvia Collum M, DO  enalapril (VASOTEC) 10 MG tablet Take 1 tablet by mouth once daily 05/30/21   Cook, Jayce G, DO  montelukast (SINGULAIR) 10 MG tablet TAKE 1 TABLET BY MOUTH AT BEDTIME 06/16/20   Elvia Collum M, DO  Multiple Vitamins-Minerals (MULTIVITAMIN WITH MINERALS) tablet Take 1 tablet by  mouth daily.    [provider]  triamcinolone cream (KENALOG) 0.1 % Apply topically. 08/25/20   [provider]    Physical Exam: Vitals:   06/18/21 0915 06/18/21 1015 06/18/21 1030 06/18/21 1130  BP:  (!) 147/93 (!) 143/88 140/89  Pulse: (!) 109 (!) 107 99 97  Resp: (!) 27 (!) 27 (!) 28 19  Temp:  (!) 100.9 F (38.3 C)    TempSrc:  Oral    SpO2: 95% 95% 94% 95%  Weight:      Height:        Constitutional: NAD, calm, comfortable Vitals:   06/18/21 0915 06/18/21 1015 06/18/21 1030 06/18/21 1130  BP:  (!) 147/93 (!) 143/88 140/89  Pulse: (!) 109 (!) 107 99 97  Resp: (!) 27 (!) 27 (!) 28 19  Temp:  (!) 100.9 F (38.3 C)     TempSrc:  Oral    SpO2: 95% 95% 94% 95%  Weight:      Height:       Eyes: lids and conjunctivae normal Neck: normal, supple Respiratory: clear to auscultation bilaterally. Normal respiratory effort. No accessory muscle use.  Cardiovascular: Regular rate and rhythm, no murmurs. Abdomen: no tenderness, no distention. Bowel sounds positive.  Musculoskeletal:  No edema. Skin: no rashes, lesions, ulcers.  Psychiatric: Flat affect  Labs on Admission: I have personally reviewed following labs and imaging studies  CBC: Recent Labs  Lab 06/18/21 0945  WBC 20.6*  NEUTROABS 17.0*  HGB 14.1  HCT 40.1  MCV 90.3  PLT 440   Basic Metabolic Panel: Recent Labs  Lab 06/18/21 0945  NA 129*  K 4.0  CL 96*  CO2 24  GLUCOSE 146*  BUN 14  CREATININE 1.02  CALCIUM 8.6*   GFR: Estimated Creatinine Clearance: 99.2 mL/min (by C-G formula based on SCr of 1.02 mg/dL). Liver Function Tests: No results for input(s): AST, ALT, ALKPHOS, BILITOT, PROT, ALBUMIN in the last 168 hours. No results for input(s): LIPASE, AMYLASE in the last 168 hours. No results for input(s): AMMONIA in the last 168 hours. Coagulation Profile: No results for input(s): INR, PROTIME in the last 168 hours. Cardiac Enzymes: No results for input(s): CKTOTAL, CKMB, CKMBINDEX, TROPONINI in the last 168 hours. BNP (last 3 results) No results for input(s): PROBNP in the last 8760 hours. HbA1C: No results for input(s): HGBA1C in the last 72 hours. CBG: No results for input(s): GLUCAP in the last 168 hours. Lipid Profile: No results for input(s): CHOL, HDL, LDLCALC, TRIG, CHOLHDL, LDLDIRECT in the last 72 hours. Thyroid Function Tests: No results for input(s): TSH, T4TOTAL, FREET4, T3FREE, THYROIDAB in the last 72 hours. Anemia Panel: No results for input(s): VITAMINB12, FOLATE, FERRITIN, TIBC, IRON, RETICCTPCT in the last 72 hours. Urine analysis: No results found for: COLORURINE, APPEARANCEUR, LABSPEC, [place],  GLUCOSEU, HGBUR, BILIRUBINUR, KETONESUR, PROTEINUR, UROBILINOGEN, NITRITE, LEUKOCYTESUR  Radiological Exams on Admission: DG Chest 2 View  Result Date: 06/18/2021 CLINICAL DATA:  Cough and shortness of breath EXAM: CHEST - 2 VIEW COMPARISON:  10/23/2017 FINDINGS: Infiltrate in the right lower lobe overlapping the lower hilum on the frontal view but more posterior on the lateral view. No edema, effusion, or pneumothorax. Chronic cardiac enlargement and elevated right diaphragm. IMPRESSION: Right lower lobe pneumonia. Followup PA and lateral chest X-ray is recommended in 3-4 weeks following trial of antibiotic therapy to ensure resolution. Electronically Signed   By: Jorje Guild M.D.   On: 06/18/2021 09:15    EKG: Independently reviewed. ST 112bpm.  Assessment/Plan Principal Problem:   Sepsis ([place])    Sepsis, ruled in, POA secondary to CAP -Continue Rocephin and Azithromycin -Continue IVF temporarily -Monitor labs  Hyponatremia -Maintain on normal saline for now -Recheck in a.m.  HTN -Continue enalapril  HLD -Continue statin  Allergic Rhinitis  -Continue inhalers and singular  Obesity -Lifestyle changes outpatient   DVT prophylaxis: Lovenox Code Status: Full Family Communication: Wife at bedside Disposition Plan:Admit for treatment for PNA Consults called:None Admission status: Obs, Tele  Severity of Illness: The appropriate patient status for this patient is OBSERVATION. Observation status is judged to be reasonable and necessary in order to provide the required intensity of service to ensure the patient's safety. The patient's presenting symptoms, physical exam findings, and initial radiographic and laboratory data in the context of their medical condition is felt to place them at decreased risk for further clinical deterioration. Furthermore, it is anticipated that the patient will be medically stable for discharge from the hospital within 2 midnights of admission.     Tariyah Pendry D Manuella Ghazi DO [facility]  If 7PM-7AM, please contact night-coverage [email or web address]  06/18/2021, 12:51 PM

## 2021-06-19 ENCOUNTER — Ambulatory Visit: Payer: BC Managed Care – PPO

## 2021-06-19 DIAGNOSIS — A419 Sepsis, unspecified organism: Secondary | ICD-10-CM | POA: Diagnosis not present

## 2021-06-19 LAB — BASIC METABOLIC PANEL
Anion gap: 7 (ref 5–15)
BUN: 13 mg/dL (ref 6–20)
CO2: 25 mmol/L (ref 22–32)
Calcium: 8.6 mg/dL — ABNORMAL LOW (ref 8.9–10.3)
Chloride: 104 mmol/L (ref 98–111)
Creatinine, Ser: 0.96 mg/dL (ref 0.61–1.24)
GFR, Estimated: >60 mL/min (ref >60)
Glucose, Bld: 99 mg/dL (ref 70–99)
Potassium: 3.8 mmol/L (ref 3.5–5.1)
Sodium: 136 mmol/L (ref 135–145)

## 2021-06-19 LAB — CBC
HCT: 40.3 % (ref 39.0–52.0)
Hemoglobin: 13.6 g/dL (ref 13.0–17.0)
MCH: 31.1 pg (ref 26.0–34.0)
MCHC: 33.7 g/dL (ref 30.0–36.0)
MCV: 92.2 fL (ref 80.0–100.0)
Platelets: 193 10*3/uL (ref 150–400)
RBC: 4.37 MIL/uL (ref 4.22–5.81)
RDW: 12 % (ref 11.5–15.5)
WBC: 16.2 10*3/uL — ABNORMAL HIGH (ref 4.0–10.5)
nRBC: 0 % (ref 0.0–0.2)

## 2021-06-19 LAB — MAGNESIUM: Magnesium: 1.9 mg/dL (ref 1.7–2.4)

## 2021-06-19 MED ORDER — AMOXICILLIN-POT CLAVULANATE 875-125 MG PO TABS: 1.0000 | ORAL_TABLET | Freq: Two times a day (BID) | ORAL | 0 refills | Status: AC

## 2021-06-19 NOTE — Plan of Care (Signed)

## 2021-06-19 NOTE — Progress Notes (Signed)
This Probation officer reviewed AVS with pt and pt wife. PIV's removed with no complaints. Pt transported via w/c downstairs to discharge.

## 2021-06-19 NOTE — TOC Progression Note (Signed)
Transition of Care [facility]) - Progression Note    Patient Details  Name: Devon Mora MRN: [id number] Date of Birth: August 06, 1965  Transition of Care [facility]) CM/SW Contact  Salome Arnt, [place] Phone Number: 06/19/2021, 10:08 AM  Clinical Narrative:  Transition of Care [facility]) Screening Note   Patient Details  Name: [name] Date of Birth: 10/19/1965   Transition of Care [facility]) CM/SW Contact:    Salome Arnt, [place] Phone Number: 06/19/2021, 10:08 AM    Transition of Care [facility]) has reviewed patient and no TOC needs have been identified at this time. We will continue to monitor patient advancement through interdisciplinary progression rounds. If new patient transition needs arise, please place a TOC consult.          Barriers to Discharge: Barriers Resolved  Expected Discharge Plan and Services           Expected Discharge Date: 06/19/21                                     Social Determinants of Health (SDOH) Interventions    Readmission Risk Interventions No flowsheet data found.

## 2021-06-19 NOTE — Discharge Summary (Signed)
Physician Discharge Summary  Devon Mora:[id number] DOB: 08/05/1965 DOA: 06/18/2021  PCP: Coral Spikes, DO  Admit date: 06/18/2021  Discharge date: 06/19/2021  Admitted From:Home  Disposition:  Home  Recommendations for Outpatient Follow-up:  Follow up with PCP in 1-2 weeks Continue on Augmentin twice daily as prescribed for 6 more days to complete 7-day course of treatment for pneumonia Continue other home medications as prior  Home Health: None  Equipment/Devices: None  Discharge Condition:Stable  CODE STATUS: Full  Diet recommendation: Heart Healthy  Brief/Interim Summary: Per HPI: Devon Mora is a 56 y.o. male with medical history significant for obesity, hypertension, dyslipidemia, and allergic rhinitis who presented to the ED with history of cough productive of yellow sputum that started approximately 1 week ago.  This initially began with some nasal congestion and then progressed to chest congestion with general malaise and weakness.  He he does endorse some vomiting as well as some nausea last night with intermittent fevers at home.  He has been taking some NyQuil and Mucinex with no improvement.  He is also struggling with some chest tightness and shortness of breath.  -Patient was admitted for sepsis secondary to community-acquired pneumonia and was started on Rocephin and azithromycin with significant improvement in his clinical condition the following day.  He did not have any initial oxygen requirements and is overall doing better this morning with decreasing amounts of leukocytosis.  He is feeling stable and well enough for discharge to home and may be transition to oral antibiotics as noted above.  He will follow-up with PCP in 1 week.  Discharge Diagnoses:  Principal Problem:   Sepsis ([place])  Principal discharge diagnosis: Sepsis, ruled in-POA secondary to community-acquired pneumonia.  Discharge Instructions  Discharge Instructions     Diet - low  sodium heart healthy   Complete by: As directed    Increase activity slowly   Complete by: As directed       Allergies as of 06/19/2021       Reactions   Dust Mite Extract Other (See Comments)   Chest tightness/runny nose/sneezing   Phentermine    GI symptoms   Pollen Extract Other (See Comments)   Chest tightness/runny nose/sneezing        Medication List     TAKE these medications    acetaminophen 500 MG tablet Commonly known as: TYLENOL Take 1,000 mg by mouth every 6 (six) hours as needed (for pain.).   albuterol 108 (90 Base) MCG/ACT inhaler Commonly known as: ProAir HFA INHALE TWO PUFFS BY MOUTH EVERY 4 HOURS AS NEEDED FOR WHEEZING   amoxicillin-clavulanate 875-125 MG tablet Commonly known as: Augmentin Take 1 tablet by mouth 2 (two) times daily for 6 days.   atorvastatin 20 MG tablet Commonly known as: LIPITOR TAKE 1 TABLET BY MOUTH ONCE DAILY AT BEDTIME   diazepam 5 MG tablet Commonly known as: VALIUM Take 1 tablet by mouth every 6 hours as needed for vertigo   enalapril 10 MG tablet Commonly known as: VASOTEC Take 1 tablet by mouth once daily   montelukast 10 MG tablet Commonly known as: SINGULAIR TAKE 1 TABLET BY MOUTH AT BEDTIME   multivitamin with minerals tablet Take 1 tablet by mouth daily.   triamcinolone cream 0.1 % Commonly known as: KENALOG Apply topically.        Follow-up Information     Coral Spikes, DO. Schedule an appointment as soon as possible for a visit in 1 week(s).   Specialty:  Family Medicine Contact information: [place] [address] [phone]                Allergies  Allergen Reactions   Dust Mite Extract Other (See Comments)    Chest tightness/runny nose/sneezing   Phentermine     GI symptoms   Pollen Extract Other (See Comments)    Chest tightness/runny nose/sneezing     Consultations: None   Procedures/Studies: DG Chest 2 View  Result Date: 06/18/2021 CLINICAL  DATA:  Cough and shortness of breath EXAM: CHEST - 2 VIEW COMPARISON:  10/23/2017 FINDINGS: Infiltrate in the right lower lobe overlapping the lower hilum on the frontal view but more posterior on the lateral view. No edema, effusion, or pneumothorax. Chronic cardiac enlargement and elevated right diaphragm. IMPRESSION: Right lower lobe pneumonia. Followup PA and lateral chest X-ray is recommended in 3-4 weeks following trial of antibiotic therapy to ensure resolution. Electronically Signed   By: Jorje Guild M.D.   On: 06/18/2021 09:15     Discharge Exam: Vitals:   06/18/21 2135 06/19/21 0458  BP: 124/65 132/80  Pulse: 88 75  Resp: 20 20  Temp: 99.5 F (37.5 C) 97.8 F (36.6 C)  SpO2: 96% 96%   Vitals:   06/18/21 1402 06/18/21 2135 06/19/21 0458 06/19/21 0621  BP: (!) 154/89 124/65 132/80   Pulse: 92 88 75   Resp: 20 20 20    Temp: 98.3 F (36.8 C) 99.5 F (37.5 C) 97.8 F (36.6 C)   TempSrc: Oral Oral Oral   SpO2: 96% 96% 96%   Weight:    117.9 kg  Height:        General: Pt is alert, awake, not in acute distress Cardiovascular: RRR, S1/S2 +, no rubs, no gallops Respiratory: CTA bilaterally, no wheezing, no rhonchi Abdominal: Soft, NT, ND, bowel sounds + Extremities: no edema, no cyanosis    The results of significant diagnostics from this hospitalization (including imaging, microbiology, ancillary and laboratory) are listed below for reference.     Microbiology: Recent Results (from the past 240 hour(s))  Resp Panel by RT-PCR (Flu A&B, Covid) Nasopharyngeal Swab     Status: None   Collection Time: 06/18/21  8:44 AM   Specimen: Nasopharyngeal Swab; Nasopharyngeal(NP) swabs in vial transport medium  Result Value Ref Range Status   SARS Coronavirus 2 by RT PCR NEGATIVE NEGATIVE Final    Comment: (NOTE) SARS-CoV-2 target nucleic acids are NOT DETECTED.  The SARS-CoV-2 RNA is generally detectable in upper respiratory specimens during the acute phase of infection.  The lowest concentration of SARS-CoV-2 viral copies this assay can detect is 138 copies/mL. A negative result does not preclude SARS-Cov-2 infection and should not be used as the sole basis for treatment or other patient management decisions. A negative result may occur with  improper specimen collection/handling, submission of specimen other than nasopharyngeal swab, presence of viral mutation(s) within the areas targeted by this assay, and inadequate number of viral copies(<138 copies/mL). A negative result must be combined with clinical observations, patient history, and epidemiological information. The expected result is Negative.  Fact Sheet for Patients:  EntrepreneurPulse.com.au  Fact Sheet for Healthcare Providers:  IncredibleEmployment.be  This test is no t yet approved or cleared by the [place] FDA and  has been authorized for detection and/or diagnosis of SARS-CoV-2 by FDA under an Emergency Use Authorization (EUA). This EUA will remain  in effect (meaning this test can be used) for the duration of the COVID-19  declaration under Section 564(b)(1) of the Act, 21 U.S.C.section 360bbb-3(b)(1), unless the authorization is terminated  or revoked sooner.       Influenza A by PCR NEGATIVE NEGATIVE Final   Influenza B by PCR NEGATIVE NEGATIVE Final    Comment: (NOTE) The Xpert Xpress SARS-CoV-2/FLU/RSV plus assay is intended as an aid in the diagnosis of influenza from Nasopharyngeal swab specimens and should not be used as a sole basis for treatment. Nasal washings and aspirates are unacceptable for Xpert Xpress SARS-CoV-2/FLU/RSV testing.  Fact Sheet for Patients: EntrepreneurPulse.com.au  Fact Sheet for Healthcare Providers: IncredibleEmployment.be  This test is not yet approved or cleared by the [place] FDA and has been authorized for detection and/or diagnosis of SARS-CoV-2 by FDA  under an Emergency Use Authorization (EUA). This EUA will remain in effect (meaning this test can be used) for the duration of the COVID-19 declaration under Section 564(b)(1) of the Act, 21 U.S.C. section 360bbb-3(b)(1), unless the authorization is terminated or revoked.  Performed at [facility], [address] [place], [place] [address]   Culture, blood (routine x 2)     Status: None (Preliminary result)   Collection Time: 06/18/21 10:06 AM   Specimen: BLOOD LEFT HAND  Result Value Ref Range Status   Specimen Description   Final    BLOOD LEFT HAND BOTTLES DRAWN AEROBIC AND ANAEROBIC   Special Requests   Final    Blood Culture adequate volume Performed at [facility], [address] [place], [place] [address]    Culture PENDING  Incomplete   Report Status PENDING  Incomplete  Culture, blood (routine x 2)     Status: None (Preliminary result)   Collection Time: 06/18/21 10:11 AM   Specimen: BLOOD RIGHT FOREARM  Result Value Ref Range Status   Specimen Description   Final    BLOOD RIGHT FOREARM BOTTLES DRAWN AEROBIC AND ANAEROBIC   Special Requests   Final    Blood Culture results may not be optimal due to an excessive volume of blood received in culture bottles Performed at [facility], [address], [place], [place] [address]    Culture PENDING  Incomplete   Report Status PENDING  Incomplete     Labs: BNP (last 3 results) No results for input(s): BNP in the last 8760 hours. Basic Metabolic Panel: Recent Labs  Lab 06/18/21 0945 06/19/21 0542  NA 129* 136  K 4.0 3.8  CL 96* 104  CO2 24 25  GLUCOSE 146* 99  BUN 14 13  CREATININE 1.02 0.96  CALCIUM 8.6* 8.6*  MG  --  1.9   Liver Function Tests: No results for input(s): AST, ALT, ALKPHOS, BILITOT, PROT, ALBUMIN in the last 168 hours. No results for input(s): LIPASE, AMYLASE in the last 168 hours. No results for input(s): AMMONIA in the last 168 hours. CBC: Recent Labs  Lab 06/18/21 0945 06/19/21 0542  WBC  20.6* 16.2*  NEUTROABS 17.0*  --   HGB 14.1 13.6  HCT 40.1 40.3  MCV 90.3 92.2  PLT 216 193   Cardiac Enzymes: No results for input(s): CKTOTAL, CKMB, CKMBINDEX, TROPONINI in the last 168 hours. BNP: Invalid input(s): POCBNP CBG: No results for input(s): GLUCAP in the last 168 hours. D-Dimer No results for input(s): DDIMER in the last 72 hours. Hgb A1c No results for input(s): HGBA1C in the last 72 hours. Lipid Profile No results for input(s): CHOL, HDL, LDLCALC, TRIG, CHOLHDL, LDLDIRECT in the last 72 hours. Thyroid function studies No results for  input(s): TSH, T4TOTAL, T3FREE, THYROIDAB in the last 72 hours.  Invalid input(s): FREET3 Anemia work up No results for input(s): VITAMINB12, FOLATE, FERRITIN, TIBC, IRON, RETICCTPCT in the last 72 hours. Urinalysis No results found for: COLORURINE, APPEARANCEUR, [place], [place], GLUCOSEU, [place], [place], [place], PROTEINUR, UROBILINOGEN, NITRITE, LEUKOCYTESUR Sepsis Labs Invalid input(s): PROCALCITONIN,  WBC,  LACTICIDVEN Microbiology Recent Results (from the past 240 hour(s))  Resp Panel by RT-PCR (Flu A&B, Covid) Nasopharyngeal Swab     Status: None   Collection Time: 06/18/21  8:44 AM   Specimen: Nasopharyngeal Swab; Nasopharyngeal(NP) swabs in vial transport medium  Result Value Ref Range Status   SARS Coronavirus 2 by RT PCR NEGATIVE NEGATIVE Final    Comment: (NOTE) SARS-CoV-2 target nucleic acids are NOT DETECTED.  The SARS-CoV-2 RNA is generally detectable in upper respiratory specimens during the acute phase of infection. The lowest concentration of SARS-CoV-2 viral copies this assay can detect is 138 copies/mL. A negative result does not preclude SARS-Cov-2 infection and should not be used as the sole basis for treatment or other patient management decisions. A negative result may occur with  improper specimen collection/handling, submission of specimen other than nasopharyngeal swab, presence of viral  mutation(s) within the areas targeted by this assay, and inadequate number of viral copies(<138 copies/mL). A negative result must be combined with clinical observations, patient history, and epidemiological information. The expected result is Negative.  Fact Sheet for Patients:  EntrepreneurPulse.com.au  Fact Sheet for Healthcare Providers:  IncredibleEmployment.be  This test is no t yet approved or cleared by the [place] FDA and  has been authorized for detection and/or diagnosis of SARS-CoV-2 by FDA under an Emergency Use Authorization (EUA). This EUA will remain  in effect (meaning this test can be used) for the duration of the COVID-19 declaration under Section 564(b)(1) of the Act, 21 U.S.C.section 360bbb-3(b)(1), unless the authorization is terminated  or revoked sooner.       Influenza A by PCR NEGATIVE NEGATIVE Final   Influenza B by PCR NEGATIVE NEGATIVE Final    Comment: (NOTE) The Xpert Xpress SARS-CoV-2/FLU/RSV plus assay is intended as an aid in the diagnosis of influenza from Nasopharyngeal swab specimens and should not be used as a sole basis for treatment. Nasal washings and aspirates are unacceptable for Xpert Xpress SARS-CoV-2/FLU/RSV testing.  Fact Sheet for Patients: EntrepreneurPulse.com.au  Fact Sheet for Healthcare Providers: IncredibleEmployment.be  This test is not yet approved or cleared by the [place] FDA and has been authorized for detection and/or diagnosis of SARS-CoV-2 by FDA under an Emergency Use Authorization (EUA). This EUA will remain in effect (meaning this test can be used) for the duration of the COVID-19 declaration under Section 564(b)(1) of the Act, 21 U.S.C. section 360bbb-3(b)(1), unless the authorization is terminated or revoked.  Performed at [facility], [address], [place], [place] [address]   Culture, blood (routine x 2)     Status:  None (Preliminary result)   Collection Time: 06/18/21 10:06 AM   Specimen: BLOOD LEFT HAND  Result Value Ref Range Status   Specimen Description   Final    BLOOD LEFT HAND BOTTLES DRAWN AEROBIC AND ANAEROBIC   Special Requests   Final    Blood Culture adequate volume Performed at [facility], [address], [place], [place] [address]    Culture PENDING  Incomplete   Report Status PENDING  Incomplete  Culture, blood (routine x 2)     Status: None (Preliminary result)   Collection Time: 06/18/21  10:11 AM   Specimen: BLOOD RIGHT FOREARM  Result Value Ref Range Status   Specimen Description   Final    BLOOD RIGHT FOREARM BOTTLES DRAWN AEROBIC AND ANAEROBIC   Special Requests   Final    Blood Culture results may not be optimal due to an excessive volume of blood received in culture bottles Performed at [facility], [address] [place], [place] [address]    Culture PENDING  Incomplete   Report Status PENDING  Incomplete     Time coordinating discharge: 35 minutes  SIGNED:   Rodena Goldmann, DO [facility] 06/19/2021, 9:22 AM  If 7PM-7AM, please contact night-coverage [email or web address]

## 2021-06-23 LAB — CULTURE, BLOOD (ROUTINE X 2)
Culture: NO GROWTH
Culture: NO GROWTH
Special Requests: ADEQUATE

## 2021-06-24 ENCOUNTER — Ambulatory Visit (INDEPENDENT_AMBULATORY_CARE_PROVIDER_SITE_OTHER): Payer: BC Managed Care – PPO | Admitting: Family Medicine

## 2021-06-24 ENCOUNTER — Other Ambulatory Visit: Payer: Self-pay

## 2021-06-24 DIAGNOSIS — I10 Essential (primary) hypertension: Secondary | ICD-10-CM

## 2021-06-24 DIAGNOSIS — A419 Sepsis, unspecified organism: Secondary | ICD-10-CM | POA: Diagnosis not present

## 2021-06-24 NOTE — Patient Instructions (Signed)
Trial off the lipitor. ? ?Follow up in 3 months. ? ?Take care ? ?Dr. Lacinda Axon  ?

## 2021-06-27 NOTE — Progress Notes (Signed)
? ?Subjective:  ?Patient ID: Devon Mora, male    DOB: 1965/12/10  Age: 56 y.o. MRN: [id number] ? ?CC: ?Chief Complaint  ?Patient presents with  ? Hospitalization Follow-up  ?  Patient in hospital for pneumonia. Stayed 2/25-2/26  ? ? ?HPI: ? ?56 year old male presents for hospital follow-up. ? ?Patient recently admitted for sepsis secondary to pneumonia.  Brief hospitalization.  Patient discharged home on Augmentin.  He has just a few pills left to finish the course.  He states that he is doing well.  No fever.  No shortness of breath. ? ?Patient blood pressure significantly elevated today.  Patient states that this is very common for him being in the doctor's office.  He states that his blood pressures are well controlled at home. ? ?Patient Active Problem List  ? Diagnosis Date Noted  ? Sepsis ([place]) 06/18/2021  ? Vertigo 07/23/2019  ? Grief at loss of child 04/16/2015  ? Mild intermittent asthma 05/09/2014  ? Allergic rhinitis 02/25/2013  ? Hypertension   ? Hyperlipidemia   ? ? ?Social Hx   ?Social History  ? ?Socioeconomic History  ? Marital status: Married  ?  Spouse name: Not on file  ? Number of children: Not on file  ? Years of education: Not on file  ? Highest education level: Not on file  ?Occupational History  ? Not on file  ?Tobacco Use  ? Smoking status: Never  ? Smokeless tobacco: Never  ?Substance and Sexual Activity  ? Alcohol use: No  ? Drug use: No  ? Sexual activity: Not on file  ?Other Topics Concern  ? Not on file  ?Social History Narrative  ? Not on file  ? ?Social Determinants of Health  ? ?Financial Resource Strain: Not on file  ?Food Insecurity: Not on file  ?Transportation Needs: Not on file  ?Physical Activity: Not on file  ?Stress: Not on file  ?Social Connections: Not on file  ? ? ?Review of Systems ?Per HPI ? ?Objective:  ?BP (!) 165/116   Pulse 90   Temp 98.6 ?F (37 ?C) (Oral)   Wt 257 lb 12.8 oz (116.9 kg)   SpO2 95%   BMI 38.07 kg/m?  ? ?BP/Weight 06/24/2021 06/19/2021 03/10/2020   ?Systolic BP 867 672 094  ?Diastolic BP 709 80 88  ?Wt. (Lbs) 257.8 259.92 264.4  ?BMI 38.07 38.38 39.05  ? ? ?Physical Exam ?Vitals and nursing note reviewed.  ?Constitutional:   ?   General: He is not in acute distress. ?   Appearance: He is obese. He is not ill-appearing.  ?HENT:  ?   Head: Normocephalic and atraumatic.  ?Eyes:  ?   General:     ?   Right eye: No discharge.     ?   Left eye: No discharge.  ?   Conjunctiva/sclera: Conjunctivae normal.  ?Cardiovascular:  ?   Rate and Rhythm: Normal rate and regular rhythm.  ?   Heart sounds: No murmur heard. ?Pulmonary:  ?   Effort: Pulmonary effort is normal.  ?   Breath sounds: Normal breath sounds. No wheezing, rhonchi or rales.  ?Neurological:  ?   Mental Status: He is alert.  ?Psychiatric:     ?   Mood and Affect: Mood normal.     ?   Behavior: Behavior normal.  ? ? ?Lab Results  ?Component Value Date  ? WBC 16.2 (H) 06/19/2021  ? HGB 13.6 06/19/2021  ? HCT 40.3 06/19/2021  ? PLT 193 06/19/2021  ?  GLUCOSE 99 06/19/2021  ? CHOL 138 09/14/2020  ? TRIG 158 (H) 09/14/2020  ? HDL 41 09/14/2020  ? Indios 70 09/14/2020  ? ALT 28 09/14/2020  ? AST 19 09/14/2020  ? NA 136 06/19/2021  ? K 3.8 06/19/2021  ? CL 104 06/19/2021  ? CREATININE 0.96 06/19/2021  ? BUN 13 06/19/2021  ? CO2 25 06/19/2021  ? PSA 0.21 08/06/2014  ? ? ? ?Assessment & Plan:  ? ?Problem List Items Addressed This Visit   ? ?  ? Other  ? Sepsis ([place])  ?  Now resolved.  Patient is doing well.  Advised to finish his antibiotic course. ?  ?  ? ?Follow-up:  Return in about 3 months (around 09/24/2021). ? ?Thersa Salt DO ?[place] ? ?

## 2021-06-27 NOTE — Assessment & Plan Note (Signed)
BPs well controlled at home.  Continue current medications. ?

## 2021-06-27 NOTE — Assessment & Plan Note (Signed)
Now resolved.  Patient is doing well.  Advised to finish his antibiotic course. ?

## 2021-06-28 ENCOUNTER — Telehealth: Payer: Self-pay | Admitting: Family Medicine

## 2021-06-28 ENCOUNTER — Telehealth: Payer: Self-pay | Admitting: *Deleted

## 2021-06-28 ENCOUNTER — Other Ambulatory Visit: Payer: Self-pay | Admitting: Family Medicine

## 2021-06-28 DIAGNOSIS — Z6838 Body mass index (BMI) 38.0-38.9, adult: Secondary | ICD-10-CM

## 2021-06-28 MED ORDER — SEMAGLUTIDE-WEIGHT MANAGEMENT 0.25 MG/0.5ML ~~LOC~~ SOAJ
0.2500 mg | SUBCUTANEOUS | 0 refills | Status: DC
Start: 2021-06-28 — End: 2021-09-26

## 2021-06-28 NOTE — Telephone Encounter (Signed)
Devon Mora denied due to requirements of plan not met. Pt has to have participated in comprehensive weight management program 6 months prior to using this drug therapy. Please advise. Thank you ?

## 2021-06-28 NOTE — Telephone Encounter (Signed)
Patient called his insurance and they told him [employer] would require a PA to see if it is covered and patient interested in trying to start the medication ? ? ?[facility] in [place] ?

## 2021-06-29 NOTE — Telephone Encounter (Signed)
Patient notified. Referral ordered for the [facility] [place] ?

## 2021-06-29 NOTE — Telephone Encounter (Signed)
Left message to return call 

## 2021-06-29 NOTE — Telephone Encounter (Signed)
If insurance requires this, I recommend referral to healthy weight and wellness.   ?

## 2021-06-29 NOTE — Addendum Note (Signed)
Addended by: Dairl Ponder on: 06/29/2021 01:56 PM ? ? Modules accepted: Orders ? ?

## 2021-07-13 ENCOUNTER — Encounter (HOSPITAL_COMMUNITY): Payer: Self-pay | Admitting: Radiology

## 2021-08-06 ENCOUNTER — Other Ambulatory Visit: Payer: Self-pay | Admitting: Family Medicine

## 2021-08-06 DIAGNOSIS — I10 Essential (primary) hypertension: Secondary | ICD-10-CM

## 2021-09-08 ENCOUNTER — Ambulatory Visit: Payer: BC Managed Care – PPO | Admitting: Nurse Practitioner

## 2021-09-08 VITALS — BP 153/89 | HR 83 | Temp 97.9°F | Wt 263.2 lb

## 2021-09-08 DIAGNOSIS — W57XXXA Bitten or stung by nonvenomous insect and other nonvenomous arthropods, initial encounter: Secondary | ICD-10-CM

## 2021-09-08 DIAGNOSIS — E785 Hyperlipidemia, unspecified: Secondary | ICD-10-CM

## 2021-09-08 DIAGNOSIS — L0390 Cellulitis, unspecified: Secondary | ICD-10-CM | POA: Diagnosis not present

## 2021-09-08 DIAGNOSIS — I10 Essential (primary) hypertension: Secondary | ICD-10-CM | POA: Diagnosis not present

## 2021-09-08 DIAGNOSIS — S20462A Insect bite (nonvenomous) of left back wall of thorax, initial encounter: Secondary | ICD-10-CM | POA: Diagnosis not present

## 2021-09-08 DIAGNOSIS — R739 Hyperglycemia, unspecified: Secondary | ICD-10-CM

## 2021-09-08 MED ORDER — DOXYCYCLINE HYCLATE 100 MG PO TABS: 100.0000 mg | ORAL_TABLET | Freq: Two times a day (BID) | ORAL | 0 refills | Status: AC

## 2021-09-08 MED ORDER — PREDNISONE 20 MG PO TABS: ORAL_TABLET | ORAL | 0 refills | Status: AC

## 2021-09-08 NOTE — Progress Notes (Signed)
Subjective:    Patient ID: Devon Mora, male    DOB: 1965-09-07, 56 y.o.   MRN: [id number]  HPI  56 year old male patient with history of hypertension, obesity, hyperlipidemia presents to the clinic today with concerns about a tick bite that he sustained on 08/22/21 on left side.  Patient's wife removed the tick.  Patient has tick in plastic bag.  Patient states that the tick could have been on him more than 24 hours.  Patient denies any fever, chills, body aches,, bull's-eye rash, joint pain, muscle pain.  Patient states that the area in which the tick was removed is currently red, swollen, and very itchy.  Review of Systems  Skin:  Positive for rash.      Objective:   Physical Exam Vitals reviewed.  Constitutional:      General: He is not in acute distress.    Appearance: Normal appearance. He is obese. He is not ill-appearing, toxic-appearing or diaphoretic.  HENT:     Head: Normocephalic and atraumatic.  Cardiovascular:     Rate and Rhythm: Normal rate and regular rhythm.     Pulses: Normal pulses.     Heart sounds: Normal heart sounds. No murmur heard. Pulmonary:     Effort: Pulmonary effort is normal. No respiratory distress.     Breath sounds: Normal breath sounds. No wheezing.  Abdominal:     General: Abdomen is flat.     Palpations: Abdomen is soft.  Musculoskeletal:     Comments: Grossly intact  Skin:    General: Skin is warm.     Capillary Refill: Capillary refill takes less than 2 seconds.     Comments: Large area of raised erythemic macular lesion noted to left side of patient's back.  Small vesicular-like lesions also noted to the area.  1 small area of ulceration noted in the center.  Area is swollen and tender to touch.  Neurological:     Mental Status: He is alert.     Comments: Grossly intact  Psychiatric:        Mood and Affect: Mood normal.        Behavior: Behavior normal.          Assessment & Plan:   1. Cellulitis, unspecified cellulitis  site -Suspect cellulitis secondary to tick bite -We will treat patient with doxycycline to both treat potential cellulitis and tickborne illness. -We will also treat with prednisone to decrease inflammation of the area as there seems to be secondary inflammation. - doxycycline (VIBRA-TABS) 100 MG tablet; Take 1 tablet (100 mg total) by mouth 2 (two) times daily for 10 days.  Dispense: 20 tablet; Refill: 0 - predniSONE (DELTASONE) 20 MG tablet; Take 2 tablets (40 mg total) by mouth daily with breakfast for 2 days, THEN 1 tablet (20 mg total) daily with breakfast for 2 days, THEN 0.5 tablets (10 mg total) daily with breakfast for 2 days.  Dispense: 7 tablet; Refill: 0 - CBC with Differential/platelets to evaluate any white blood cell counts. -Follow up with Dr. Lacinda Axon in 2 weeks or sooner if needed  2. Tick bite of left back wall of thorax, initial encounter - doxycycline (VIBRA-TABS) 100 MG tablet; Take 1 tablet (100 mg total) by mouth 2 (two) times daily for 10 days.  Dispense: 20 tablet; Refill: 0 - predniSONE (DELTASONE) 20 MG tablet; Take 2 tablets (40 mg total) by mouth daily with breakfast for 2 days, THEN 1 tablet (20 mg total) daily with breakfast for 2 days,  THEN 0.5 tablets (10 mg total) daily with breakfast for 2 days.  Dispense: 7 tablet; Refill: 0 -Return to clinic if symptoms do not get better within 2 to 3 days of antibiotic use or if symptoms worsen -Go to the emergency room if you develop fever, body aches, chills, joint pains, muscle aches    Note:  This document was prepared using Dragon voice recognition software and may include unintentional dictation errors. Note - This record has been created using [employer].  Chart creation errors have been sought, but may not always  have been located. Such creation errors do not reflect on  the standard of medical care.

## 2021-09-09 ENCOUNTER — Encounter: Payer: Self-pay | Admitting: Nurse Practitioner

## 2021-09-22 LAB — CMP14+EGFR
ALT: 33 IU/L (ref 0–44)
AST: 26 IU/L (ref 0–40)
Albumin/Globulin Ratio: 1.6 (ref 1.2–2.2)
Albumin: 4.4 g/dL (ref 3.8–4.9)
Alkaline Phosphatase: 45 IU/L (ref 44–121)
BUN/Creatinine Ratio: 11 (ref 9–20)
BUN: 12 mg/dL (ref 6–24)
Bilirubin Total: 0.8 mg/dL (ref 0.0–1.2)
CO2: 23 mmol/L (ref 20–29)
Calcium: 9.2 mg/dL (ref 8.7–10.2)
Chloride: 103 mmol/L (ref 96–106)
Creatinine, Ser: 1.05 mg/dL (ref 0.76–1.27)
Globulin, Total: 2.7 g/dL (ref 1.5–4.5)
Glucose: 109 mg/dL — ABNORMAL HIGH (ref 70–99)
Potassium: 4.9 mmol/L (ref 3.5–5.2)
Sodium: 139 mmol/L (ref 134–144)
Total Protein: 7.1 g/dL (ref 6.0–8.5)
eGFR: 83 mL/min/{1.73_m2} (ref >59)

## 2021-09-22 LAB — LIPID PANEL WITH LDL/HDL RATIO
Cholesterol, Total: 229 mg/dL — ABNORMAL HIGH (ref 100–199)
HDL: 32 mg/dL — ABNORMAL LOW (ref >39)
LDL Chol Calc (NIH): 126 mg/dL — ABNORMAL HIGH (ref 0–99)
LDL/HDL Ratio: 3.9 ratio — ABNORMAL HIGH (ref 0.0–3.6)
Triglycerides: 397 mg/dL — ABNORMAL HIGH (ref 0–149)
VLDL Cholesterol Cal: 71 mg/dL — ABNORMAL HIGH (ref 5–40)

## 2021-09-22 LAB — HEMOGLOBIN A1C
Est. average glucose Bld gHb Est-mCnc: 111 mg/dL
Hgb A1c MFr Bld: 5.5 % (ref 4.8–5.6)

## 2021-09-26 ENCOUNTER — Ambulatory Visit: Payer: BC Managed Care – PPO | Admitting: Family Medicine

## 2021-09-26 VITALS — BP 150/102 | HR 98 | Temp 98.1°F | Ht 69.0 in | Wt 259.0 lb

## 2021-09-26 DIAGNOSIS — I10 Essential (primary) hypertension: Secondary | ICD-10-CM

## 2021-09-26 DIAGNOSIS — J4520 Mild intermittent asthma, uncomplicated: Secondary | ICD-10-CM | POA: Diagnosis not present

## 2021-09-26 DIAGNOSIS — L282 Other prurigo: Secondary | ICD-10-CM

## 2021-09-26 DIAGNOSIS — R0789 Other chest pain: Secondary | ICD-10-CM

## 2021-09-26 DIAGNOSIS — E785 Hyperlipidemia, unspecified: Secondary | ICD-10-CM

## 2021-09-26 DIAGNOSIS — I4949 Other premature depolarization: Secondary | ICD-10-CM

## 2021-09-26 DIAGNOSIS — R42 Dizziness and giddiness: Secondary | ICD-10-CM

## 2021-09-26 MED ORDER — ENALAPRIL MALEATE 10 MG PO TABS: 10.0000 mg | ORAL_TABLET | Freq: Every day | ORAL | 1 refills | Status: DC

## 2021-09-26 MED ORDER — ALBUTEROL SULFATE HFA 108 (90 BASE) MCG/ACT IN AERS: INHALATION_SPRAY | RESPIRATORY_TRACT | 2 refills | Status: AC

## 2021-09-26 MED ORDER — ATORVASTATIN CALCIUM 20 MG PO TABS: 20.0000 mg | ORAL_TABLET | Freq: Every day | ORAL | 3 refills | Status: DC

## 2021-09-26 MED ORDER — DIAZEPAM 5 MG PO TABS: ORAL_TABLET | ORAL | 0 refills | Status: DC

## 2021-09-26 NOTE — Assessment & Plan Note (Signed)
Referring for second opinion.

## 2021-09-26 NOTE — Patient Instructions (Addendum)
Restart the Lipitor.  Referring to Dermatology (2nd opinion).  Follow up in 6 months.  Take care  Dr. Lacinda Axon

## 2021-09-26 NOTE — Assessment & Plan Note (Signed)
BP elevated here.  He endorses good control at home.  Continue enalapril.

## 2021-09-26 NOTE — Assessment & Plan Note (Signed)
Restarting atorvastatin.

## 2021-09-26 NOTE — Assessment & Plan Note (Signed)
Suspected PACs or PVCs.  EKG was obtained today was independently reviewed by me.  Interpretation: Normal axis.  Normal intervals.  Rate 88.  No ST or T wave changes.  Normal EKG.  Advised close monitoring and supportive care.

## 2021-09-26 NOTE — Progress Notes (Signed)
Subjective:  Patient ID: Devon Mora, male    DOB: July 29, 1965  Age: 56 y.o. MRN: [id number]  CC: Chief Complaint  Patient presents with   Hyperlipidemia   Hypertension    HPI:  56 year old male presents for follow-up.  Patient's blood pressure is elevated here.  Patient states that his home readings are in the 120s over 70s.  He endorses compliance with enalapril.  Patient's labs have returned and his lipids are uncontrolled.  His atorvastatin was previously stopped as it was potentially thought to be the cause of his ongoing rash.  This was recommended by dermatology.  Cessation of the Lipitor has not impacted his rash.  We will plan to restart.  Patient reports continued intermittent rash on the head and neck.  Raised, erythematous papules which are itchy.  Topical steroids have not helped.  Patient denies chest pain or shortness of breath.  Patient states that he has had some times where he has felt nervousness or anxiousness in the center of his chest.  Patient Active Problem List   Diagnosis Date Noted   Premature beats 09/26/2021   Pruritic rash 09/26/2021   Mild intermittent asthma 05/09/2014   Hypertension    Hyperlipidemia     Social Hx   Social History   Socioeconomic History   Marital status: Married    Spouse name: Not on file   Number of children: Not on file   Years of education: Not on file   Highest education level: Not on file  Occupational History   Not on file  Tobacco Use   Smoking status: Never   Smokeless tobacco: Never  Substance and Sexual Activity   Alcohol use: No   Drug use: No   Sexual activity: Not on file  Other Topics Concern   Not on file  Social History Narrative   Not on file   Social Determinants of Health   Financial Resource Strain: Not on file  Food Insecurity: Not on file  Transportation Needs: Not on file  Physical Activity: Not on file  Stress: Not on file  Social Connections: Not on file    Review of  Systems Per HPI  Objective:  BP (!) 150/102   Pulse 98   Temp 98.1 F (36.7 C)   Ht '5\' 9"'$  (1.753 m)   Wt 259 lb (117.5 kg)   SpO2 97%   BMI 38.25 kg/m      09/26/2021    8:19 AM 09/08/2021    3:45 PM 06/24/2021   11:08 AM  BP/Weight  Systolic BP 202 542 706  Diastolic BP 237 89 628  Wt. (Lbs) 259 263.2 257.8  BMI 38.25 kg/m2 38.87 kg/m2 38.07 kg/m2    Physical Exam Constitutional:      General: He is not in acute distress.    Appearance: Normal appearance. He is not ill-appearing.  HENT:     Head: Normocephalic and atraumatic.  Eyes:     General:        Right eye: No discharge.        Left eye: No discharge.     Conjunctiva/sclera: Conjunctivae normal.  Cardiovascular:     Rate and Rhythm: Normal rate.     Comments: Irregular.  Ectopy noted. Pulmonary:     Effort: Pulmonary effort is normal.     Breath sounds: Normal breath sounds. No wheezing, rhonchi or rales.  Neurological:     Mental Status: He is alert.  Psychiatric:  Mood and Affect: Mood normal.        Behavior: Behavior normal.    Lab Results  Component Value Date   WBC 16.2 (H) 06/19/2021   HGB 13.6 06/19/2021   HCT 40.3 06/19/2021   PLT 193 06/19/2021   GLUCOSE 109 (H) 09/21/2021   CHOL 229 (H) 09/21/2021   TRIG 397 (H) 09/21/2021   HDL 32 (L) 09/21/2021   LDLCALC 126 (H) 09/21/2021   ALT 33 09/21/2021   AST 26 09/21/2021   NA 139 09/21/2021   K 4.9 09/21/2021   CL 103 09/21/2021   CREATININE 1.05 09/21/2021   BUN 12 09/21/2021   CO2 23 09/21/2021   PSA 0.21 08/06/2014   HGBA1C 5.5 09/21/2021     Assessment & Plan:   Problem List Items Addressed This Visit       Cardiovascular and Mediastinum   Hypertension - Primary    BP elevated here.  He endorses good control at home.  Continue enalapril.       Relevant Medications   enalapril (VASOTEC) 10 MG tablet   atorvastatin (LIPITOR) 20 MG tablet   Premature beats    Suspected PACs or PVCs.  EKG was obtained today was  independently reviewed by me.  Interpretation: Normal axis.  Normal intervals.  Rate 88.  No ST or T wave changes.  Normal EKG.  Advised close monitoring and supportive care.       Relevant Medications   enalapril (VASOTEC) 10 MG tablet   atorvastatin (LIPITOR) 20 MG tablet     Respiratory   Mild intermittent asthma   Relevant Medications   albuterol (PROAIR HFA) 108 (90 Base) MCG/ACT inhaler     Musculoskeletal and Integument   Pruritic rash    Referring for second opinion.       Relevant Orders   Ambulatory referral to Dermatology     Other   Hyperlipidemia    Restarting atorvastatin.       Relevant Medications   enalapril (VASOTEC) 10 MG tablet   atorvastatin (LIPITOR) 20 MG tablet   Other Visit Diagnoses     Vertigo       Relevant Medications   diazepam (VALIUM) 5 MG tablet   Chest tightness       Relevant Orders   EKG 12-Lead (Completed)       Meds ordered this encounter  Medications   diazepam (VALIUM) 5 MG tablet    Sig: Take 1 tablet by mouth every 6 hours as needed for vertigo    Dispense:  24 tablet    Refill:  0    [facility] [place]   enalapril (VASOTEC) 10 MG tablet    Sig: Take 1 tablet (10 mg total) by mouth daily.    Dispense:  90 tablet    Refill:  1   albuterol (PROAIR HFA) 108 (90 Base) MCG/ACT inhaler    Sig: INHALE TWO PUFFS BY MOUTH EVERY 4 HOURS AS NEEDED FOR WHEEZING    Dispense:  18 g    Refill:  2    Please consider 90 day supplies to promote better adherence   atorvastatin (LIPITOR) 20 MG tablet    Sig: Take 1 tablet (20 mg total) by mouth daily.    Dispense:  90 tablet    Refill:  3    Follow-up:  Return in about 6 months (around 03/28/2022).  [place]

## 2022-03-28 ENCOUNTER — Other Ambulatory Visit: Payer: Self-pay | Admitting: Family Medicine

## 2022-03-28 ENCOUNTER — Ambulatory Visit: Payer: BC Managed Care – PPO | Admitting: Family Medicine

## 2022-03-28 ENCOUNTER — Encounter: Payer: Self-pay | Admitting: Family Medicine

## 2022-03-28 VITALS — BP 144/83 | HR 60 | Wt 261.0 lb

## 2022-03-28 DIAGNOSIS — N529 Male erectile dysfunction, unspecified: Secondary | ICD-10-CM

## 2022-03-28 DIAGNOSIS — L409 Psoriasis, unspecified: Secondary | ICD-10-CM | POA: Insufficient documentation

## 2022-03-28 DIAGNOSIS — E669 Obesity, unspecified: Secondary | ICD-10-CM | POA: Diagnosis not present

## 2022-03-28 DIAGNOSIS — D72829 Elevated white blood cell count, unspecified: Secondary | ICD-10-CM | POA: Diagnosis not present

## 2022-03-28 DIAGNOSIS — E785 Hyperlipidemia, unspecified: Secondary | ICD-10-CM | POA: Diagnosis not present

## 2022-03-28 DIAGNOSIS — Z125 Encounter for screening for malignant neoplasm of prostate: Secondary | ICD-10-CM

## 2022-03-28 DIAGNOSIS — I10 Essential (primary) hypertension: Secondary | ICD-10-CM | POA: Diagnosis not present

## 2022-03-28 NOTE — Assessment & Plan Note (Signed)
Discussed today.  Checking testosterone. No need for pharmacotherapy at this time.  If continues to be troublesome will start on sildenafil or tadalafil.

## 2022-03-28 NOTE — Patient Instructions (Signed)
Labs today.  Continue your meds.  Follow up in 6 months  Take care  Dr. Lacinda Axon

## 2022-03-28 NOTE — Assessment & Plan Note (Signed)
Stable.  Continue enalapril.

## 2022-03-28 NOTE — Progress Notes (Signed)
Subjective:  Patient ID: Devon Mora, male    DOB: 1965-05-03  Age: 56 y.o. MRN: [id number]  CC: Chief Complaint  Patient presents with   Hypertension    Blood pressure doing well. No s/s HTN. Pt is wanting to know if his cholesterol needs to be checked.Marland Kitchen     HPI:  56 year old male with asthma, hyperlipidemia, hypertension presents for follow-up.  Patient's hypertension is well-controlled.  Home readings reflect good control.  BP mildly elevated here.  His blood pressure is always mildly elevated here.  He is compliant with his medication.  He is on enalapril 10 mg daily.  Patient is compliant with Lipitor.  Needs reassessment of lipids.  Patient recently diagnosed with psoriasis.  This is improving with topical steroids and Singulair.  Patient reports that over the past 3 to 4 months he has felt that his erections are not as strong as they used to be.  He is able to get an erection.  He states that his stamina is not as good.  He would like to discuss this today.  Patient Active Problem List   Diagnosis Date Noted   Psoriasis 03/28/2022   Erectile dysfunction 03/28/2022   Premature beats 09/26/2021   Mild intermittent asthma 05/09/2014   Hypertension    Hyperlipidemia     Social Hx   Social History   Socioeconomic History   Marital status: Married    Spouse name: Not on file   Number of children: Not on file   Years of education: Not on file   Highest education level: Not on file  Occupational History   Not on file  Tobacco Use   Smoking status: Never   Smokeless tobacco: Never  Substance and Sexual Activity   Alcohol use: No   Drug use: No   Sexual activity: Not on file  Other Topics Concern   Not on file  Social History Narrative   Not on file   Social Determinants of Health   Financial Resource Strain: Not on file  Food Insecurity: Not on file  Transportation Needs: Not on file  Physical Activity: Not on file  Stress: Not on file  Social  Connections: Not on file    Review of Systems Per HPI  Objective:  BP (!) 144/83   Pulse 60   Wt 261 lb (118.4 kg)   SpO2 97%   BMI 38.54 kg/m      03/28/2022    9:18 AM 09/26/2021    8:19 AM 09/08/2021    3:45 PM  BP/Weight  Systolic BP 335 456 256  Diastolic BP 83 389 89  Wt. (Lbs) 261 259 263.2  BMI 38.54 kg/m2 38.25 kg/m2 38.87 kg/m2    Physical Exam Vitals and nursing note reviewed.  Constitutional:      Appearance: Normal appearance. He is obese.  HENT:     Head: Normocephalic and atraumatic.  Eyes:     General:        Right eye: No discharge.        Left eye: No discharge.     Conjunctiva/sclera: Conjunctivae normal.  Cardiovascular:     Rate and Rhythm: Normal rate and regular rhythm.  Pulmonary:     Effort: Pulmonary effort is normal.     Breath sounds: Normal breath sounds. No wheezing, rhonchi or rales.  Neurological:     Mental Status: He is alert.  Psychiatric:        Mood and Affect: Mood normal.  Behavior: Behavior normal.     Lab Results  Component Value Date   WBC 16.2 (H) 06/19/2021   HGB 13.6 06/19/2021   HCT 40.3 06/19/2021   PLT 193 06/19/2021   GLUCOSE 109 (H) 09/21/2021   CHOL 229 (H) 09/21/2021   TRIG 397 (H) 09/21/2021   HDL 32 (L) 09/21/2021   LDLCALC 126 (H) 09/21/2021   ALT 33 09/21/2021   AST 26 09/21/2021   NA 139 09/21/2021   K 4.9 09/21/2021   CL 103 09/21/2021   CREATININE 1.05 09/21/2021   BUN 12 09/21/2021   CO2 23 09/21/2021   PSA 0.21 08/06/2014   HGBA1C 5.5 09/21/2021     Assessment & Plan:   Problem List Items Addressed This Visit       Cardiovascular and Mediastinum   Hypertension - Primary    Stable.  Continue enalapril.      Relevant Orders   CMP14+EGFR     Other   Erectile dysfunction    Discussed today.  Checking testosterone. No need for pharmacotherapy at this time.  If continues to be troublesome will start on sildenafil or tadalafil.      Relevant Orders   Testosterone Free  with SHBG   Hyperlipidemia    Reassessing with lipid panel.  Continue Lipitor.      Relevant Orders   Lipid panel   Other Visit Diagnoses     Leukocytosis, unspecified type       Relevant Orders   CBC   Obesity (BMI 30-39.9)       Relevant Orders   Hemoglobin A1c   Prostate cancer screening       Relevant Orders   PSA      Follow-up:  Return in about 6 months (around 09/27/2022).  [place]

## 2022-03-28 NOTE — Assessment & Plan Note (Signed)
Reassessing with lipid panel.  Continue Lipitor.

## 2022-03-29 LAB — TESTOSTERONE: Testosterone: 278 ng/dL (ref 264–916)

## 2022-04-04 LAB — HEMOGLOBIN A1C
Est. average glucose Bld gHb Est-mCnc: 114 mg/dL
Hgb A1c MFr Bld: 5.6 % (ref 4.8–5.6)

## 2022-04-04 LAB — CMP14+EGFR
ALT: 35 IU/L (ref 0–44)
AST: 27 IU/L (ref 0–40)
Albumin/Globulin Ratio: 2 (ref 1.2–2.2)
Albumin: 4.7 g/dL (ref 3.8–4.9)
Alkaline Phosphatase: 58 IU/L (ref 44–121)
BUN/Creatinine Ratio: 17 (ref 9–20)
BUN: 19 mg/dL (ref 6–24)
Bilirubin Total: 1.1 mg/dL (ref 0.0–1.2)
CO2: 25 mmol/L (ref 20–29)
Calcium: 9.5 mg/dL (ref 8.7–10.2)
Chloride: 102 mmol/L (ref 96–106)
Creatinine, Ser: 1.15 mg/dL (ref 0.76–1.27)
Globulin, Total: 2.4 g/dL (ref 1.5–4.5)
Glucose: 102 mg/dL — ABNORMAL HIGH (ref 70–99)
Potassium: 4.8 mmol/L (ref 3.5–5.2)
Sodium: 139 mmol/L (ref 134–144)
Total Protein: 7.1 g/dL (ref 6.0–8.5)
eGFR: 74 mL/min/{1.73_m2} (ref >59)

## 2022-04-04 LAB — CBC
Hematocrit: 43.9 % (ref 37.5–51.0)
Hemoglobin: 15.5 g/dL (ref 13.0–17.7)
MCH: 32.6 pg (ref 26.6–33.0)
MCHC: 35.3 g/dL (ref 31.5–35.7)
MCV: 92 fL (ref 79–97)
Platelets: 241 10*3/uL (ref 150–450)
RBC: 4.76 x10E6/uL (ref 4.14–5.80)
RDW: 11.9 % (ref 11.6–15.4)
WBC: 5.7 10*3/uL (ref 3.4–10.8)

## 2022-04-04 LAB — LIPID PANEL
Chol/HDL Ratio: 3.3 ratio (ref 0.0–5.0)
Cholesterol, Total: 88 mg/dL — ABNORMAL LOW (ref 100–199)
HDL: 27 mg/dL — ABNORMAL LOW (ref >39)
LDL Chol Calc (NIH): 41 mg/dL (ref 0–99)
Triglycerides: 103 mg/dL (ref 0–149)
VLDL Cholesterol Cal: 20 mg/dL (ref 5–40)

## 2022-04-04 LAB — PSA: Prostate Specific Ag, Serum: 0.4 ng/mL (ref 0.0–4.0)

## 2022-04-05 ENCOUNTER — Other Ambulatory Visit: Payer: Self-pay

## 2022-04-05 DIAGNOSIS — E785 Hyperlipidemia, unspecified: Secondary | ICD-10-CM

## 2022-04-05 MED ORDER — ATORVASTATIN CALCIUM 10 MG PO TABS: 10.0000 mg | ORAL_TABLET | Freq: Every day | ORAL | 1 refills | Status: DC

## 2022-04-13 ENCOUNTER — Telehealth: Payer: Self-pay | Admitting: Family Medicine

## 2022-04-13 NOTE — Telephone Encounter (Signed)
Error

## 2022-04-13 NOTE — Telephone Encounter (Signed)
Pt contacted and verbalized understanding.  

## 2022-04-13 NOTE — Telephone Encounter (Signed)
Pt called in and states he is having cough, dry throat and his voice is "out of wack". Pt states family has had the cold going around. Symptoms began yesterday afternoon. No availability today. Please advise. Thank you

## 2022-04-14 ENCOUNTER — Ambulatory Visit
Admission: RE | Admit: 2022-04-14 | Discharge: 2022-04-14 | Disposition: A | Discharge status: Home / Self Care | Payer: BC Managed Care – PPO | Source: Ambulatory Visit | Attending: Nurse Practitioner | Admitting: Nurse Practitioner

## 2022-04-14 VITALS — BP 147/90 | HR 80 | Temp 99.2°F | Resp 18

## 2022-04-14 DIAGNOSIS — Z1152 Encounter for screening for COVID-19: Secondary | ICD-10-CM | POA: Insufficient documentation

## 2022-04-14 DIAGNOSIS — J069 Acute upper respiratory infection, unspecified: Secondary | ICD-10-CM | POA: Insufficient documentation

## 2022-04-14 NOTE — Discharge Instructions (Signed)
You have a viral upper respiratory infection.  Symptoms should improve over the next week to 10 days.  If you develop chest pain or shortness of breath, go to the emergency room.  We have tested you today for COVID-19.  You will see the results in Mychart and we will call you with positive results.    Please stay home and isolate until you are aware of the results.    Some things that can make you feel better are: - Coricidin products - Increased rest - Increasing fluid with water/sugar free electrolytes - Acetaminophen and ibuprofen as needed for fever/pain - Salt water gargling, chloraseptic spray and throat lozenges for sore throat - OTC guaifenesin (Mucinex) 600 mg twice daily for nasal congestion - Saline sinus flushes or a neti pot for nasal congestion - Humidifying the air -Tessalon Perles every 8 hours as needed for dry cough

## 2022-04-14 NOTE — ED Provider Notes (Signed)
[facility]    CSN: [id number] Arrival date & time: 04/14/22  1315      History   Chief Complaint Chief Complaint  Patient presents with   Cough    Dry throat. family has had sinus infections this week and symptoms started Wednesday morning and couldn't get in Dr office - Entered by patient   Appointment    1330    HPI Devon Mora is a 56 y.o. male.   Patient presents today for 2-day history of low-grade fever, chills, congested cough, chest tightness from coughing, nasal congestion and runny nose, dry throat at nighttime, sinus pressure and headache, and fatigue.  He denies shortness of breath or chest pain, chest congestion, ear pain, abdominal pain, nausea/vomiting, diarrhea, decreased appetite, and loss of taste or smell.  Reports both his sister and mother were diagnosed with sinus infections earlier this week.  Has been taking Tylenol and NyQuil for symptoms without much benefit.    Past Medical History:  Diagnosis Date   Allergic rhinitis    CTS (carpal tunnel syndrome)    Hyperlipidemia    Hypertension    IFG (impaired fasting glucose)    Obesity     Patient Active Problem List   Diagnosis Date Noted   Psoriasis 03/28/2022   Erectile dysfunction 03/28/2022   Premature beats 09/26/2021   Mild intermittent asthma 05/09/2014   Hypertension    Hyperlipidemia     Past Surgical History:  Procedure Laterality Date   COLONOSCOPY N/A 03/18/2018   Procedure: COLONOSCOPY;  Surgeon: Rogene Houston, MD;  Location: [facility];  Service: Endoscopy;  Laterality: N/A;  830   POLYPECTOMY  03/18/2018   Procedure: POLYPECTOMY;  Surgeon: Rogene Houston, MD;  Location: [facility];  Service: Endoscopy;;  colon   right rotator cuff     VASECTOMY         Home Medications    Prior to Admission medications   Medication Sig Start Date End Date Taking? Authorizing Provider  acetaminophen (TYLENOL) 500 MG tablet Take 1,000 mg by mouth every 6 (six)  hours as needed (for pain.).    [provider]  albuterol (PROAIR HFA) 108 (90 Base) MCG/ACT inhaler INHALE TWO PUFFS BY MOUTH EVERY 4 HOURS AS NEEDED FOR WHEEZING 09/26/21   Cook, Jayce G, DO  atorvastatin (LIPITOR) 10 MG tablet Take 1 tablet (10 mg total) by mouth daily. 04/05/22   Coral Spikes, DO  diazepam (VALIUM) 5 MG tablet Take 1 tablet by mouth every 6 hours as needed for vertigo 09/26/21   Coral Spikes, DO  enalapril (VASOTEC) 10 MG tablet Take 1 tablet (10 mg total) by mouth daily. 09/26/21   Coral Spikes, DO  montelukast (SINGULAIR) 10 MG tablet 1 tablet Orally Once at night for 30 days 03/22/22   [provider]  Multiple Vitamins-Minerals (MULTIVITAMIN WITH MINERALS) tablet Take 1 tablet by mouth daily.    [provider]  triamcinolone cream (KENALOG) 0.1 % Apply topically. 08/25/20   [provider]    Family History Family History  Problem Relation Age of Onset   Hypertension Mother    Cancer Father        Renal cell    Social History Social History   Tobacco Use   Smoking status: Never   Smokeless tobacco: Never  Substance Use Topics   Alcohol use: No   Drug use: No     Allergies   Dust mite extract, Phentermine,  and Pollen extract   Review of Systems Review of Systems Per HPI  Physical Exam Triage Vital Signs ED Triage Vitals  Enc Vitals Group     BP 04/14/22 1405 (!) 147/90     Pulse Rate 04/14/22 1405 80     Resp 04/14/22 1405 18     Temp 04/14/22 1405 99.2 F (37.3 C)     Temp Source 04/14/22 1405 Oral     SpO2 04/14/22 1405 96 %     Weight --      Height --      Head Circumference --      Peak Flow --      Pain Score 04/14/22 1404 0     Pain Loc --      Pain Edu? --      Excl. in [place]? --    No data found.  Updated Vital Signs BP (!) 147/90 (BP Location: Right Arm)   Pulse 80   Temp 99.2 F (37.3 C) (Oral)   Resp 18   SpO2 96%   Visual Acuity Right Eye Distance:   Left Eye Distance:   Bilateral  Distance:    Right Eye Near:   Left Eye Near:    Bilateral Near:     Physical Exam Vitals and nursing note reviewed.  Constitutional:      General: He is not in acute distress.    Appearance: Normal appearance. He is not ill-appearing or toxic-appearing.  HENT:     Head: Normocephalic and atraumatic.     Right Ear: Tympanic membrane, ear canal and external ear normal.     Left Ear: Tympanic membrane, ear canal and external ear normal.     Nose: Congestion and rhinorrhea present.     Mouth/Throat:     Mouth: Mucous membranes are moist.     Pharynx: Oropharynx is clear. Posterior oropharyngeal erythema present. No oropharyngeal exudate.     Comments: Erythematous patch noted to left half of hard palate, patient reports he has a birthmark Eyes:     General: No scleral icterus.    Extraocular Movements: Extraocular movements intact.  Cardiovascular:     Rate and Rhythm: Normal rate and regular rhythm.  Pulmonary:     Effort: Pulmonary effort is normal. No respiratory distress.     Breath sounds: Normal breath sounds. No wheezing, rhonchi or rales.  Abdominal:     General: Abdomen is flat. Bowel sounds are normal. There is no distension.     Palpations: Abdomen is soft.     Tenderness: There is no abdominal tenderness.  Musculoskeletal:     Cervical back: Normal range of motion and neck supple.  Lymphadenopathy:     Cervical: Cervical adenopathy present.  Skin:    General: Skin is warm and dry.     Coloration: Skin is not jaundiced or pale.     Findings: No erythema or rash.  Neurological:     Mental Status: He is alert and oriented to person, place, and time.  Psychiatric:        Behavior: Behavior is cooperative.      UC Treatments / Results  Labs (all labs ordered are listed, but only abnormal results are displayed) Labs Reviewed  SARS CORONAVIRUS 2 (TAT 6-24 HRS)    EKG   Radiology No results found.  Procedures Procedures (including critical care  time)  Medications Ordered in UC Medications - No data to display  Initial Impression / Assessment and Plan / UC Course  I have reviewed the triage vital signs and the nursing notes.  Pertinent labs & imaging results that were available during my care of the patient were reviewed by me and considered in my medical decision making (see chart for details).   Patient is well-appearing, normotensive, afebrile, not tachycardic, not tachypneic, oxygenating well on room air.    Viral URI with cough Encounter for screening for COVID-19 Suspect viral etiology COVID-19 testing performed, unable to perform influenza testing secondary to national backorder of testing materials Supportive care discussed -specifically recommended Coricidin products, Tessalon Perles as needed for dry cough ER and return precautions discussed   The patient was given the opportunity to ask questions.  All questions answered to their satisfaction.  The patient is in agreement to this plan.    Final Clinical Impressions(s) / UC Diagnoses   Final diagnoses:  Viral URI with cough  Encounter for screening for COVID-19     Discharge Instructions      You have a viral upper respiratory infection.  Symptoms should improve over the next week to 10 days.  If you develop chest pain or shortness of breath, go to the emergency room.  We have tested you today for COVID-19.  You will see the results in Mychart and we will call you with positive results.    Please stay home and isolate until you are aware of the results.    Some things that can make you feel better are: - Coricidin products - Increased rest - Increasing fluid with water/sugar free electrolytes - Acetaminophen and ibuprofen as needed for fever/pain - Salt water gargling, chloraseptic spray and throat lozenges for sore throat - OTC guaifenesin (Mucinex) 600 mg twice daily for nasal congestion - Saline sinus flushes or a neti pot for nasal congestion -  Humidifying the air -Tessalon Perles every 8 hours as needed for dry cough     ED Prescriptions   None    PDMP not reviewed this encounter.   Eulogio Bear, NP 04/14/22 1428

## 2022-04-14 NOTE — ED Triage Notes (Signed)
Pt reports cough, nasal congestion and chest congestion x 2 days. Reports brother has COVID.   Reports negative COVID test on 04/13/22.

## 2022-04-15 LAB — SARS CORONAVIRUS 2 (TAT 6-24 HRS): SARS Coronavirus 2: POSITIVE — AB

## 2022-08-08 ENCOUNTER — Other Ambulatory Visit: Payer: Self-pay | Admitting: Family Medicine

## 2022-08-08 DIAGNOSIS — I10 Essential (primary) hypertension: Secondary | ICD-10-CM

## 2022-09-27 ENCOUNTER — Ambulatory Visit: Payer: BC Managed Care – PPO | Admitting: Family Medicine

## 2022-09-27 VITALS — BP 152/82 | Temp 98.4°F | Ht 69.0 in | Wt 260.0 lb

## 2022-09-27 DIAGNOSIS — E785 Hyperlipidemia, unspecified: Secondary | ICD-10-CM

## 2022-09-27 DIAGNOSIS — I10 Essential (primary) hypertension: Secondary | ICD-10-CM

## 2022-09-27 MED ORDER — ATORVASTATIN CALCIUM 10 MG PO TABS: 10.0000 mg | ORAL_TABLET | Freq: Every day | ORAL | 3 refills | Status: DC

## 2022-09-27 MED ORDER — ENALAPRIL MALEATE 10 MG PO TABS: 10.0000 mg | ORAL_TABLET | Freq: Two times a day (BID) | ORAL | 1 refills | Status: DC

## 2022-09-27 NOTE — Progress Notes (Signed)
Subjective:  Patient ID: Devon Mora, male    DOB: 1965/07/14  Age: 57 y.o. MRN: [id number]  CC: Chief Complaint  Patient presents with   Hypertension    HPI:  57 year old male with asthma, hypertension, psoriasis, hyperlipidemia presents for follow-up.  Patient reports that he is now on Tremfya for psoriasis.  Blood pressure is elevated here today.  He is compliant with enalapril to milligrams daily regarding hypertension.  Lipids have been well-controlled on Lipitor.  Needs repeat lipid panel.  Patient Active Problem List   Diagnosis Date Noted   Psoriasis 03/28/2022   Erectile dysfunction 03/28/2022   Mild intermittent asthma 05/09/2014   Hypertension    Hyperlipidemia     Social Hx   Social History   Socioeconomic History   Marital status: Married    Spouse name: Not on file   Number of children: Not on file   Years of education: Not on file   Highest education level: 12th grade  Occupational History   Not on file  Tobacco Use   Smoking status: Never   Smokeless tobacco: Never  Substance and Sexual Activity   Alcohol use: No   Drug use: No   Sexual activity: Yes  Other Topics Concern   Not on file  Social History Narrative   Not on file   Social Determinants of Health   Financial Resource Strain: Low Risk  (09/23/2022)   Overall Financial Resource Strain (CARDIA)    Difficulty of Paying Living Expenses: Not hard at all  Food Insecurity: No Food Insecurity (09/23/2022)   Hunger Vital Sign    Worried About Running Out of Food in the Last Year: Never true    Ran Out of Food in the Last Year: Never true  Transportation Needs: No Transportation Needs (09/23/2022)   PRAPARE - Administrator, Civil Service (Medical): No    Lack of Transportation (Non-Medical): No  Physical Activity: Insufficiently Active (09/23/2022)   Exercise Vital Sign    Days of Exercise per Week: 5 days    Minutes of Exercise per Session: 20 min  Stress: Stress Concern  Present (09/23/2022)   [employer] of Occupational Health - Occupational Stress Questionnaire    Feeling of Stress : To some extent  Social Connections: Moderately Isolated (09/23/2022)   Social Connection and Isolation Panel [NHANES]    Frequency of Communication with Friends and Family: More than three times a week    Frequency of Social Gatherings with Friends and Family: More than three times a week    Attends Religious Services: Never    Database administrator or Organizations: No    Attends Engineer, structural: Not on file    Marital Status: Married    Review of Systems  Respiratory: Negative.    Cardiovascular: Negative.   Skin:  Positive for rash.    Objective:  BP (!) 152/82   Temp 98.4 F (36.9 C)   Ht 5\' 9"  (1.753 m)   Wt 260 lb (117.9 kg)   SpO2 97%   BMI 38.40 kg/m      09/27/2022   10:17 AM 09/27/2022    9:25 AM 04/14/2022    2:05 PM  BP/Weight  Systolic BP 152 159 147  Diastolic BP 82 89 90  Wt. (Lbs)  260   BMI  38.4 kg/m2     Physical Exam Vitals and nursing note reviewed.  Constitutional:      General: He is not in acute  distress.    Appearance: Normal appearance. He is obese.  HENT:     Head: Normocephalic and atraumatic.  Eyes:     General:        Right eye: No discharge.        Left eye: No discharge.     Conjunctiva/sclera: Conjunctivae normal.  Cardiovascular:     Rate and Rhythm: Normal rate and regular rhythm.  Pulmonary:     Effort: Pulmonary effort is normal.     Breath sounds: Normal breath sounds. No wheezing, rhonchi or rales.  Neurological:     Mental Status: He is alert.  Psychiatric:        Mood and Affect: Mood normal.        Behavior: Behavior normal.     Lab Results  Component Value Date   WBC 5.7 04/03/2022   HGB 15.5 04/03/2022   HCT 43.9 04/03/2022   PLT 241 04/03/2022   GLUCOSE 102 (H) 04/03/2022   CHOL 88 (L) 04/03/2022   TRIG 103 04/03/2022   HDL 27 (L) 04/03/2022   LDLCALC 41 04/03/2022    ALT 35 04/03/2022   AST 27 04/03/2022   NA 139 04/03/2022   K 4.8 04/03/2022   CL 102 04/03/2022   CREATININE 1.15 04/03/2022   BUN 19 04/03/2022   CO2 25 04/03/2022   PSA 0.21 08/06/2014   HGBA1C 5.6 04/03/2022     Assessment & Plan:   Problem List Items Addressed This Visit       Cardiovascular and Mediastinum   Hypertension - Primary    Uncontrolled. Increased Enalapril to 10 BID. Labs in 7-10 days.       Relevant Medications   atorvastatin (LIPITOR) 10 MG tablet   enalapril (VASOTEC) 10 MG tablet   Other Relevant Orders   CMP14+EGFR     Other   Hyperlipidemia    Stable. Lipid panel to reassess. Continue Lipitor.       Relevant Medications   atorvastatin (LIPITOR) 10 MG tablet   enalapril (VASOTEC) 10 MG tablet   Other Relevant Orders   Lipid panel    Meds ordered this encounter  Medications   atorvastatin (LIPITOR) 10 MG tablet    Sig: Take 1 tablet (10 mg total) by mouth daily.    Dispense:  90 tablet    Refill:  3   enalapril (VASOTEC) 10 MG tablet    Sig: Take 1 tablet (10 mg total) by mouth 2 (two) times daily.    Dispense:  180 tablet    Refill:  1    Follow-up:  Return in about 6 months (around 03/29/2023).  Everlene Other DO [facility]

## 2022-09-27 NOTE — Patient Instructions (Signed)
Labs in ~ 2 weeks.  I increased the Enalapril to twice daily.  Follow up in 6 months.

## 2022-09-27 NOTE — Assessment & Plan Note (Signed)
Uncontrolled. Increased Enalapril to 10 BID. Labs in 7-10 days.

## 2022-09-27 NOTE — Assessment & Plan Note (Signed)
Stable. Lipid panel to reassess. Continue Lipitor.

## 2022-10-07 LAB — CMP14+EGFR
ALT: 29 IU/L (ref 0–44)
AST: 22 IU/L (ref 0–40)
Albumin/Globulin Ratio: 1.8
Albumin: 4.6 g/dL (ref 3.8–4.9)
Alkaline Phosphatase: 54 IU/L (ref 44–121)
BUN/Creatinine Ratio: 16 (ref 9–20)
BUN: 18 mg/dL (ref 6–24)
Bilirubin Total: 1.4 mg/dL — ABNORMAL HIGH (ref 0.0–1.2)
CO2: 24 mmol/L (ref 20–29)
Calcium: 9.3 mg/dL (ref 8.7–10.2)
Chloride: 103 mmol/L (ref 96–106)
Creatinine, Ser: 1.11 mg/dL (ref 0.76–1.27)
Globulin, Total: 2.6 g/dL (ref 1.5–4.5)
Glucose: 103 mg/dL — ABNORMAL HIGH (ref 70–99)
Potassium: 4.6 mmol/L (ref 3.5–5.2)
Sodium: 141 mmol/L (ref 134–144)
Total Protein: 7.2 g/dL (ref 6.0–8.5)
eGFR: 77 mL/min/{1.73_m2} (ref >59)

## 2022-10-07 LAB — LIPID PANEL
Chol/HDL Ratio: 3.2 ratio (ref 0.0–5.0)
Cholesterol, Total: 133 mg/dL (ref 100–199)
HDL: 41 mg/dL (ref >39)
LDL Chol Calc (NIH): 71 mg/dL (ref 0–99)
Triglycerides: 114 mg/dL (ref 0–149)
VLDL Cholesterol Cal: 21 mg/dL (ref 5–40)

## 2022-11-04 ENCOUNTER — Other Ambulatory Visit: Payer: Self-pay | Admitting: Family Medicine

## 2022-11-04 DIAGNOSIS — I10 Essential (primary) hypertension: Secondary | ICD-10-CM

## 2022-11-28 ENCOUNTER — Ambulatory Visit: Payer: BC Managed Care – PPO | Admitting: Family Medicine

## 2022-11-28 VITALS — BP 163/90 | HR 68 | Temp 97.7°F | Ht 69.0 in | Wt 261.0 lb

## 2022-11-28 DIAGNOSIS — I10 Essential (primary) hypertension: Secondary | ICD-10-CM | POA: Diagnosis not present

## 2022-11-28 MED ORDER — AMLODIPINE BESYLATE 5 MG PO TABS: 5.0000 mg | ORAL_TABLET | Freq: Every day | ORAL | 0 refills | Status: DC

## 2022-11-28 NOTE — Patient Instructions (Signed)
Medication as prescribed.  Try weight watchers.  Take care  Dr. Adriana Simas

## 2022-11-28 NOTE — Progress Notes (Signed)
Subjective:  Patient ID: Devon Mora, male    DOB: 1965/12/29  Age: 57 y.o. MRN: [id number]  CC: HTN  HPI:  57 year old male with hypertension, asthma, psoriasis, hyperlipidemia presents for follow-up regarding hypertension.  Patient brings in his home blood pressure readings.  A few of them are at goal.  Most of them are elevated.  He is taking enalapril 10 mg twice daily.  He states that typically his blood pressure readings in the morning are higher.  He reports tenderness associated with elevated blood pressures.  No other associated symptoms.  Patient Active Problem List   Diagnosis Date Noted   Psoriasis 03/28/2022   Erectile dysfunction 03/28/2022   Mild intermittent asthma 05/09/2014   Hypertension    Hyperlipidemia     Social Hx   Social History   Socioeconomic History   Marital status: Married    Spouse name: Not on file   Number of children: Not on file   Years of education: Not on file   Highest education level: 12th grade  Occupational History   Not on file  Tobacco Use   Smoking status: Never   Smokeless tobacco: Never  Substance and Sexual Activity   Alcohol use: No   Drug use: No   Sexual activity: Yes  Other Topics Concern   Not on file  Social History Narrative   Not on file   Social Determinants of Health   Financial Resource Strain: Low Risk  (09/23/2022)   Overall Financial Resource Strain (CARDIA)    Difficulty of Paying Living Expenses: Not hard at all  Food Insecurity: No Food Insecurity (09/23/2022)   Hunger Vital Sign    Worried About Running Out of Food in the Last Year: Never true    Ran Out of Food in the Last Year: Never true  Transportation Needs: No Transportation Needs (09/23/2022)   PRAPARE - Administrator, Civil Service (Medical): No    Lack of Transportation (Non-Medical): No  Physical Activity: Insufficiently Active (09/23/2022)   Exercise Vital Sign    Days of Exercise per Week: 5 days    Minutes of Exercise per  Session: 20 min  Stress: Stress Concern Present (09/23/2022)   [employer] of Occupational Health - Occupational Stress Questionnaire    Feeling of Stress : To some extent  Social Connections: Moderately Isolated (09/23/2022)   Social Connection and Isolation Panel [NHANES]    Frequency of Communication with Friends and Family: More than three times a week    Frequency of Social Gatherings with Friends and Family: More than three times a week    Attends Religious Services: Never    Database administrator or Organizations: No    Attends Engineer, structural: Not on file    Marital Status: Married    Review of Systems Per HPI  Objective:  BP (!) 163/90   Pulse 68   Temp 97.7 F (36.5 C)   Ht 5\' 9"  (1.753 m)   Wt 261 lb (118.4 kg)   SpO2 97%   BMI 38.54 kg/m      11/28/2022    3:42 PM 09/27/2022   10:17 AM 09/27/2022    9:25 AM  BP/Weight  Systolic BP 163 152 159  Diastolic BP 90 82 89  Wt. (Lbs) 261  260  BMI 38.54 kg/m2  38.4 kg/m2    Physical Exam Vitals reviewed.  Constitutional:      General: He is not in acute distress.  Appearance: He is obese.  HENT:     Head: Normocephalic and atraumatic.  Cardiovascular:     Rate and Rhythm: Normal rate and regular rhythm.  Pulmonary:     Effort: Pulmonary effort is normal.     Breath sounds: Normal breath sounds.  Neurological:     Mental Status: He is alert.  Psychiatric:        Mood and Affect: Mood normal.        Behavior: Behavior normal.     Lab Results  Component Value Date   WBC 5.7 04/03/2022   HGB 15.5 04/03/2022   HCT 43.9 04/03/2022   PLT 241 04/03/2022   GLUCOSE 103 (H) 10/06/2022   CHOL 133 10/06/2022   TRIG 114 10/06/2022   HDL 41 10/06/2022   LDLCALC 71 10/06/2022   ALT 29 10/06/2022   AST 22 10/06/2022   NA 141 10/06/2022   K 4.6 10/06/2022   CL 103 10/06/2022   CREATININE 1.11 10/06/2022   BUN 18 10/06/2022   CO2 24 10/06/2022   PSA 0.21 08/06/2014   HGBA1C 5.6 04/03/2022      Assessment & Plan:   Problem List Items Addressed This Visit       Cardiovascular and Mediastinum   Hypertension - Primary    Uncontrolled/not at goal. Adding Norvasc.      Relevant Medications   amLODipine (NORVASC) 5 MG tablet    Meds ordered this encounter  Medications   amLODipine (NORVASC) 5 MG tablet    Sig: Take 1 tablet (5 mg total) by mouth daily.    Dispense:  90 tablet    Refill:  0    Follow-up:  Return in about 1 month (around 12/29/2022) for HTN follow up.  Everlene Other DO [facility]

## 2022-11-28 NOTE — Assessment & Plan Note (Signed)
Uncontrolled/not at goal. Adding Norvasc.

## 2023-01-01 ENCOUNTER — Ambulatory Visit: Payer: BC Managed Care – PPO | Admitting: Family Medicine

## 2023-01-01 ENCOUNTER — Encounter: Payer: Self-pay | Admitting: Family Medicine

## 2023-01-01 DIAGNOSIS — I10 Essential (primary) hypertension: Secondary | ICD-10-CM

## 2023-01-01 MED ORDER — ENALAPRIL MALEATE 20 MG PO TABS
20.0000 mg | ORAL_TABLET | Freq: Two times a day (BID) | ORAL | 1 refills | Status: DC
Start: 2023-01-01 — End: 2023-03-29

## 2023-01-01 NOTE — Progress Notes (Signed)
Subjective:  Patient ID: Devon Mora, male    DOB: 1965/10/31  Age: 57 y.o. MRN: [id number]  CC: Chief Complaint  Patient presents with   Hypertension    HPI:  57 year old male presents for follow-up regarding hypertension.  Patient reports that his home blood pressure readings are still elevated.  BP elevated here today as well.  He endorses compliance with enalapril 10 mg twice daily as well as amlodipine 5 mg daily.  No Mora effects.  He states that he has fatigue but has no difficulty doing his normal activities.  No chest pain or shortness of breath.   Patient Active Problem List   Diagnosis Date Noted   Psoriasis 03/28/2022   Erectile dysfunction 03/28/2022   Mild intermittent asthma 05/09/2014   Hypertension    Hyperlipidemia     Social Hx   Social History   Socioeconomic History   Marital status: Married    Spouse name: Not on file   Number of children: Not on file   Years of education: Not on file   Highest education level: 12th grade  Occupational History   Not on file  Tobacco Use   Smoking status: Never   Smokeless tobacco: Never  Substance and Sexual Activity   Alcohol use: No   Drug use: No   Sexual activity: Yes  Other Topics Concern   Not on file  Social History Narrative   Not on file   Social Determinants of Health   Financial Resource Strain: Low Risk  (09/23/2022)   Overall Financial Resource Strain (CARDIA)    Difficulty of Paying Living Expenses: Not hard at all  Food Insecurity: No Food Insecurity (09/23/2022)   Hunger Vital Sign    Worried About Running Out of Food in the Last Year: Never true    Ran Out of Food in the Last Year: Never true  Transportation Needs: No Transportation Needs (09/23/2022)   PRAPARE - Administrator, Civil Service (Medical): No    Lack of Transportation (Non-Medical): No  Physical Activity: Insufficiently Active (09/23/2022)   Exercise Vital Sign    Days of Exercise per Week: 5 days    Minutes  of Exercise per Session: 20 min  Stress: Stress Concern Present (09/23/2022)   [employer] of Occupational Health - Occupational Stress Questionnaire    Feeling of Stress : To some extent  Social Connections: Moderately Isolated (09/23/2022)   Social Connection and Isolation Panel [NHANES]    Frequency of Communication with Friends and Family: More than three times a week    Frequency of Social Gatherings with Friends and Family: More than three times a week    Attends Religious Services: Never    Database administrator or Organizations: No    Attends Engineer, structural: Not on file    Marital Status: Married    Review of Systems Per HPI  Objective:  BP (!) 154/94   Pulse 68   Temp 98.9 F (37.2 C)   Wt 258 lb (117 kg)   SpO2 97%   BMI 38.10 kg/m      01/01/2023   10:25 AM 11/28/2022    3:42 PM 09/27/2022   10:17 AM  BP/Weight  Systolic BP 154 163 152  Diastolic BP 94 90 82  Wt. (Lbs) 258 261   BMI 38.1 kg/m2 38.54 kg/m2     Physical Exam Vitals and nursing note reviewed.  Constitutional:      General: He is not  in acute distress.    Appearance: Normal appearance.  HENT:     Head: Normocephalic and atraumatic.  Eyes:     General:        Right eye: No discharge.        Left eye: No discharge.     Conjunctiva/sclera: Conjunctivae normal.  Cardiovascular:     Rate and Rhythm: Normal rate and regular rhythm.  Pulmonary:     Effort: Pulmonary effort is normal.     Breath sounds: Normal breath sounds. No wheezing, rhonchi or rales.  Neurological:     Mental Status: He is alert.  Psychiatric:        Mood and Affect: Mood normal.        Behavior: Behavior normal.     Lab Results  Component Value Date   WBC 5.7 04/03/2022   HGB 15.5 04/03/2022   HCT 43.9 04/03/2022   PLT 241 04/03/2022   GLUCOSE 103 (H) 10/06/2022   CHOL 133 10/06/2022   TRIG 114 10/06/2022   HDL 41 10/06/2022   LDLCALC 71 10/06/2022   ALT 29 10/06/2022   AST 22 10/06/2022    NA 141 10/06/2022   K 4.6 10/06/2022   CL 103 10/06/2022   CREATININE 1.11 10/06/2022   BUN 18 10/06/2022   CO2 24 10/06/2022   PSA 0.21 08/06/2014   HGBA1C 5.6 04/03/2022     Assessment & Plan:   Problem List Items Addressed This Visit       Cardiovascular and Mediastinum   Hypertension    Remains uncontrolled.  Increasing enalapril.  Metabolic panel in 2 weeks.      Relevant Medications   enalapril (VASOTEC) 20 MG tablet   Other Relevant Orders   Basic Metabolic Panel    Meds ordered this encounter  Medications   enalapril (VASOTEC) 20 MG tablet    Sig: Take 1 tablet (20 mg total) by mouth 2 (two) times daily.    Dispense:  180 tablet    Refill:  1    Follow-up:  Return in about 3 months (around 04/02/2023).  Everlene Other DO [facility]

## 2023-01-01 NOTE — Patient Instructions (Signed)
Lab draw in 2 weeks.  Medication increased - Enalapril 20 mg twice daily.  Follow up in 3 months.

## 2023-01-01 NOTE — Assessment & Plan Note (Signed)
Remains uncontrolled.  Increasing enalapril.  Metabolic panel in 2 weeks.

## 2023-01-18 LAB — BASIC METABOLIC PANEL
BUN/Creatinine Ratio: 17 (ref 9–20)
BUN: 18 mg/dL (ref 6–24)
CO2: 23 mmol/L (ref 20–29)
Calcium: 9.6 mg/dL (ref 8.7–10.2)
Chloride: 102 mmol/L (ref 96–106)
Creatinine, Ser: 1.04 mg/dL (ref 0.76–1.27)
Glucose: 103 mg/dL — ABNORMAL HIGH (ref 70–99)
Potassium: 4.6 mmol/L (ref 3.5–5.2)
Sodium: 140 mmol/L (ref 134–144)
eGFR: 83 mL/min/{1.73_m2} (ref >59)

## 2023-02-22 ENCOUNTER — Other Ambulatory Visit: Payer: Self-pay | Admitting: Family Medicine

## 2023-03-29 ENCOUNTER — Ambulatory Visit: Payer: BC Managed Care – PPO | Admitting: Family Medicine

## 2023-03-29 ENCOUNTER — Encounter: Payer: Self-pay | Admitting: Family Medicine

## 2023-03-29 VITALS — BP 138/64 | HR 74 | Temp 98.2°F | Ht 69.0 in | Wt 263.0 lb

## 2023-03-29 DIAGNOSIS — E669 Obesity, unspecified: Secondary | ICD-10-CM

## 2023-03-29 DIAGNOSIS — E785 Hyperlipidemia, unspecified: Secondary | ICD-10-CM | POA: Diagnosis not present

## 2023-03-29 DIAGNOSIS — I10 Essential (primary) hypertension: Secondary | ICD-10-CM | POA: Diagnosis not present

## 2023-03-29 MED ORDER — AMLODIPINE BESYLATE 5 MG PO TABS: 5.0000 mg | ORAL_TABLET | Freq: Every day | ORAL | 3 refills | Status: DC

## 2023-03-29 MED ORDER — ENALAPRIL MALEATE 20 MG PO TABS
20.0000 mg | ORAL_TABLET | Freq: Two times a day (BID) | ORAL | 3 refills | Status: DC
Start: 2023-03-29 — End: 2023-09-27

## 2023-03-29 NOTE — Assessment & Plan Note (Signed)
Control improved.  Continue enalapril and amlodipine.

## 2023-03-29 NOTE — Patient Instructions (Signed)
Continue your meds.  Follow up in 6 months.

## 2023-03-29 NOTE — Assessment & Plan Note (Signed)
Discussed dietary changes/lifestyle changes.

## 2023-03-29 NOTE — Assessment & Plan Note (Signed)
Stable. Continue Lipitor. 

## 2023-03-29 NOTE — Progress Notes (Signed)
Subjective:  Patient ID: Devon Mora, male    DOB: 1965/09/07  Age: 57 y.o. MRN: [id number]  CC:   Chief Complaint  Patient presents with   Hypertension    HPI:  57 year old male presents for follow-up.  Hypertension control has improved.  BP fairly well-controlled here today.  He is compliant with amlodipine and enalapril.  Patient still struggling with psoriasis.  Followed by dermatology.  On Tremfya.  Lipids stable on atorvastatin.  Tolerating.  Patient interested in weight loss.  Will discuss this today.  Patient Active Problem List   Diagnosis Date Noted   Obesity (BMI 30-39.9) 03/29/2023   Psoriasis 03/28/2022   Mild intermittent asthma 05/09/2014   Hypertension    Hyperlipidemia     Social Hx   Social History   Socioeconomic History   Marital status: Married    Spouse name: Not on file   Number of children: Not on file   Years of education: Not on file   Highest education level: 12th grade  Occupational History   Not on file  Tobacco Use   Smoking status: Never   Smokeless tobacco: Never  Substance and Sexual Activity   Alcohol use: No   Drug use: No   Sexual activity: Yes  Other Topics Concern   Not on file  Social History Narrative   Not on file   Social Determinants of Health   Financial Resource Strain: Low Risk  (03/25/2023)   Overall Financial Resource Strain (CARDIA)    Difficulty of Paying Living Expenses: Not hard at all  Food Insecurity: No Food Insecurity (03/25/2023)   Hunger Vital Sign    Worried About Running Out of Food in the Last Year: Never true    Ran Out of Food in the Last Year: Never true  Transportation Needs: No Transportation Needs (03/25/2023)   PRAPARE - Administrator, Civil Service (Medical): No    Lack of Transportation (Non-Medical): No  Physical Activity: Sufficiently Active (03/25/2023)   Exercise Vital Sign    Days of Exercise per Week: 3 days    Minutes of Exercise per Session: 50 min  Stress:  No Stress Concern Present (03/25/2023)   [employer] of Occupational Health - Occupational Stress Questionnaire    Feeling of Stress : Only a little  Social Connections: Moderately Integrated (03/25/2023)   Social Connection and Isolation Panel [NHANES]    Frequency of Communication with Friends and Family: Three times a week    Frequency of Social Gatherings with Friends and Family: More than three times a week    Attends Religious Services: 1 to 4 times per year    Active Member of [employer] or Organizations: No    Attends Engineer, structural: Not on file    Marital Status: Married    Review of Systems  Constitutional: Negative.   Respiratory: Negative.    Cardiovascular: Negative.   Skin:  Positive for rash.   Objective:  BP 138/64   Pulse 74   Temp 98.2 F (36.8 C)   Ht 5\' 9"  (1.753 m)   Wt 263 lb (119.3 kg)   SpO2 96%   BMI 38.84 kg/m      03/29/2023    9:40 AM 01/01/2023   10:25 AM 11/28/2022    3:42 PM  BP/Weight  Systolic BP 138 154 163  Diastolic BP 64 94 90  Wt. (Lbs) 263 258 261  BMI 38.84 kg/m2 38.1 kg/m2 38.54 kg/m2    Physical  Exam Vitals and nursing note reviewed.  Constitutional:      General: He is not in acute distress.    Appearance: Normal appearance. He is obese.  HENT:     Head: Normocephalic and atraumatic.  Eyes:     General:        Right eye: No discharge.        Left eye: No discharge.     Conjunctiva/sclera: Conjunctivae normal.  Cardiovascular:     Rate and Rhythm: Normal rate and regular rhythm.  Pulmonary:     Effort: Pulmonary effort is normal.     Breath sounds: Normal breath sounds. No wheezing, rhonchi or rales.  Neurological:     Mental Status: He is alert.  Psychiatric:        Mood and Affect: Mood normal.        Behavior: Behavior normal.     Lab Results  Component Value Date   WBC 5.7 04/03/2022   HGB 15.5 04/03/2022   HCT 43.9 04/03/2022   PLT 241 04/03/2022   GLUCOSE 103 (H) 01/17/2023   CHOL 133  10/06/2022   TRIG 114 10/06/2022   HDL 41 10/06/2022   LDLCALC 71 10/06/2022   ALT 29 10/06/2022   AST 22 10/06/2022   NA 140 01/17/2023   K 4.6 01/17/2023   CL 102 01/17/2023   CREATININE 1.04 01/17/2023   BUN 18 01/17/2023   CO2 23 01/17/2023   PSA 0.21 08/06/2014   HGBA1C 5.6 04/03/2022     Assessment & Plan:   Problem List Items Addressed This Visit       Cardiovascular and Mediastinum   Hypertension - Primary    Control improved.  Continue enalapril and amlodipine.      Relevant Medications   enalapril (VASOTEC) 20 MG tablet   amLODipine (NORVASC) 5 MG tablet     Other   Obesity (BMI 30-39.9)    Discussed dietary changes/lifestyle changes.      Hyperlipidemia    Stable.  Continue Lipitor.      Relevant Medications   enalapril (VASOTEC) 20 MG tablet   amLODipine (NORVASC) 5 MG tablet    Meds ordered this encounter  Medications   enalapril (VASOTEC) 20 MG tablet    Sig: Take 1 tablet (20 mg total) by mouth 2 (two) times daily.    Dispense:  180 tablet    Refill:  3   amLODipine (NORVASC) 5 MG tablet    Sig: Take 1 tablet (5 mg total) by mouth daily.    Dispense:  90 tablet    Refill:  3    Follow-up:  Return in about 6 months (around 09/27/2023) for Follow up Chronic medical issues.  Everlene Other DO [facility]

## 2023-04-01 ENCOUNTER — Encounter: Payer: Self-pay | Admitting: Family Medicine

## 2023-04-02 ENCOUNTER — Ambulatory Visit: Payer: BC Managed Care – PPO | Admitting: Family Medicine

## 2023-05-24 ENCOUNTER — Ambulatory Visit: Payer: Self-pay | Admitting: Family Medicine

## 2023-05-24 ENCOUNTER — Telehealth: Payer: 59 | Admitting: Family Medicine

## 2023-05-24 DIAGNOSIS — J40 Bronchitis, not specified as acute or chronic: Secondary | ICD-10-CM

## 2023-05-24 DIAGNOSIS — J111 Influenza due to unidentified influenza virus with other respiratory manifestations: Secondary | ICD-10-CM | POA: Diagnosis not present

## 2023-05-24 MED ORDER — AZITHROMYCIN 250 MG PO TABS: ORAL_TABLET | ORAL | 0 refills | Status: AC

## 2023-05-24 MED ORDER — BENZONATATE 200 MG PO CAPS: 200.0000 mg | ORAL_CAPSULE | Freq: Two times a day (BID) | ORAL | 0 refills | Status: DC | PRN

## 2023-05-24 MED ORDER — OSELTAMIVIR PHOSPHATE 75 MG PO CAPS: 75.0000 mg | ORAL_CAPSULE | Freq: Two times a day (BID) | ORAL | 0 refills | Status: AC

## 2023-05-24 NOTE — Patient Instructions (Signed)

## 2023-05-24 NOTE — Telephone Encounter (Signed)
  Chief Complaint: Cough Symptoms: Malaise, Cough, Chest Tightness, Body Aches Frequency: Since Monday Pertinent Negatives: Patient denies chest pain, shortness of breath. Disposition: [] ED /[] Urgent Care (no appt availability in office) / [] Appointment(In office/virtual)/ [x]  [facility]/ [] Home Care/ [] Refused Recommended Disposition /[] [facility]/ []  Follow-up with PCP Additional Notes: TD is being triaged for a cough, malaise, and a low grade fever. The patient denies acute, distress like symptoms. History of asthma. No audible wheezing or dyspnea noted during triage. Virtual visit made for today due to lack of in office availability.   Reason for Disposition  Fever present > 3 days (72 hours)  Answer Assessment - Initial Assessment Questions 1. ONSET: "When did the cough begin?"      Since Monday  2. SEVERITY: "How bad is the cough today?"      Mild  3. SPUTUM: "Describe the color of your sputum" (none, dry cough; clear, white, yellow, green)     White  4. HEMOPTYSIS: "Are you coughing up any blood?" If so ask: "How much?" (flecks, streaks, tablespoons, etc.)     No  5. DIFFICULTY BREATHING: "Are you having difficulty breathing?" If Yes, ask: "How bad is it?" (e.g., mild, moderate, severe)    - MILD: No SOB at rest, mild SOB with walking, speaks normally in sentences, can lie down, no retractions, pulse < 100.    - MODERATE: SOB at rest, SOB with minimal exertion and prefers to sit, cannot lie down flat, speaks in phrases, mild retractions, audible wheezing, pulse 100-120.    - SEVERE: Very SOB at rest, speaks in single words, struggling to breathe, sitting hunched forward, retractions, pulse > 120      None  6. FEVER: "Do you have a fever?" If Yes, ask: "What is your temperature, how was it measured, and when did it start?"     99.4  7. CARDIAC HISTORY: "Do you have any history of heart disease?" (e.g., heart attack, congestive heart failure)       No  8. LUNG HISTORY: "Do you have any history of lung disease?"  (e.g., pulmonary embolus, asthma, emphysema)     Asthma  9. PE RISK FACTORS: "Do you have a history of blood clots?" (or: recent major surgery, recent prolonged travel, bedridden)     No  10. OTHER SYMPTOMS: "Do you have any other symptoms?" (e.g., runny nose, wheezing, chest pain)       Malaise, Body Aches, Cough, Chest Tightness  12. TRAVEL: "Have you traveled out of the country in the last month?" (e.g., travel history, exposures)       No  Protocols used: Cough - Acute Non-Productive-A-AH

## 2023-05-24 NOTE — Progress Notes (Signed)
Virtual Visit Consent   [name], you are scheduled for a virtual visit with a [facility] provider today. Just as with appointments in the office, your consent must be obtained to participate. Your consent will be active for this visit and any virtual visit you may have with one of our providers in the next 365 days. If you have a MyChart account, a copy of this consent can be sent to you electronically.  As this is a virtual visit, video technology does not allow for your provider to perform a traditional examination. This may limit your provider's ability to fully assess your condition. If your provider identifies any concerns that need to be evaluated in person or the need to arrange testing (such as labs, EKG, etc.), we will make arrangements to do so. Although advances in technology are sophisticated, we cannot ensure that it will always work on either your end or our end. If the connection with a video visit is poor, the visit may have to be switched to a telephone visit. With either a video or telephone visit, we are not always able to ensure that we have a secure connection.  By engaging in this virtual visit, you consent to the provision of healthcare and authorize for your insurance to be billed (if applicable) for the services provided during this visit. Depending on your insurance coverage, you may receive a charge related to this service.  I need to obtain your verbal consent now. Are you willing to proceed with your visit today? DORTHY HUSTEAD has provided verbal consent on 05/24/2023 for a virtual visit (video or telephone). Georgana Curio, FNP  Date: 05/24/2023 6:31 PM  Virtual Visit via Video Note   I, Georgana Curio, connected with  UMBERTO Mora  ([id number], 07/18/1965) on 05/24/23 at  6:30 PM EST by a video-enabled telemedicine application and verified that I am speaking with the correct person using two identifiers.  Location: Patient: Virtual Visit Location Patient:  Home Provider: Virtual Visit Location Provider: Home Office   I discussed the limitations of evaluation and management by telemedicine and the availability of in person appointments. The patient expressed understanding and agreed to proceed.    History of Present Illness: Devon Mora is a 58 y.o. who identifies as a male who was assigned male at birth, and is being seen today for cough since last week, body aches, fever and chills since lunchtime today. No wheezing or sob. History of pneumonia last year.Marland Kitchen  HPI: HPI  Problems:  Patient Active Problem List   Diagnosis Date Noted   Obesity (BMI 30-39.9) 03/29/2023   Psoriasis 03/28/2022   Mild intermittent asthma 05/09/2014   Hypertension    Hyperlipidemia     Allergies:  Allergies  Allergen Reactions   Dust Mite Extract Other (See Comments)    Chest tightness/runny nose/sneezing   Phentermine     GI symptoms   Pollen Extract Other (See Comments)    Chest tightness/runny nose/sneezing    Medications:  Current Outpatient Medications:    azithromycin (ZITHROMAX) 250 MG tablet, Take 2 tablets on day 1, then 1 tablet daily on days 2 through 5, Disp: 6 tablet, Rfl: 0   benzonatate (TESSALON) 200 MG capsule, Take 1 capsule (200 mg total) by mouth 2 (two) times daily as needed for cough., Disp: 20 capsule, Rfl: 0   oseltamivir (TAMIFLU) 75 MG capsule, Take 1 capsule (75 mg total) by mouth 2 (two) times daily for 5 days., Disp: 10 capsule,  Rfl: 0   acetaminophen (TYLENOL) 500 MG tablet, Take 1,000 mg by mouth every 6 (six) hours as needed (for pain.)., Disp: , Rfl:    albuterol (PROAIR HFA) 108 (90 Base) MCG/ACT inhaler, INHALE TWO PUFFS BY MOUTH EVERY 4 HOURS AS NEEDED FOR WHEEZING, Disp: 18 g, Rfl: 2   amLODipine (NORVASC) 5 MG tablet, Take 1 tablet (5 mg total) by mouth daily., Disp: 90 tablet, Rfl: 3   atorvastatin (LIPITOR) 10 MG tablet, Take 1 tablet (10 mg total) by mouth daily., Disp: 90 tablet, Rfl: 3   enalapril (VASOTEC) 20  MG tablet, Take 1 tablet (20 mg total) by mouth 2 (two) times daily., Disp: 180 tablet, Rfl: 3   Multiple Vitamins-Minerals (MULTIVITAMIN WITH MINERALS) tablet, Take 1 tablet by mouth daily., Disp: , Rfl:    TREMFYA 100 MG/ML SOPN, Inject into the skin., Disp: , Rfl:   Observations/Objective: Patient is well-developed, well-nourished in no acute distress.  Resting comfortably  at home.  Head is normocephalic, atraumatic.  No labored breathing.  Speech is clear and coherent with logical content.  Patient is alert and oriented at baseline.    Assessment and Plan: 1. Bronchitis (Primary)  2. Influenza-like illness  Increase fluids, humidifier at night, tylenol or ibuprofen a directed, UC as needed. Will cover for influenza and pneumonia due to history.  Follow Up Instructions: I discussed the assessment and treatment plan with the patient. The patient was provided an opportunity to ask questions and all were answered. The patient agreed with the plan and demonstrated an understanding of the instructions.  A copy of instructions were sent to the patient via MyChart unless otherwise noted below.     The patient was advised to call back or seek an in-person evaluation if the symptoms worsen or if the condition fails to improve as anticipated.    Georgana Curio, FNP

## 2023-06-09 IMAGING — DX DG CHEST 2V
2 series · 2 of 2 positions shown · non-contrast
Comparison: 10/23/2017

CLINICAL DATA: Cough and shortness of breath

EXAM:
CHEST - 2 VIEW

[chest ap]
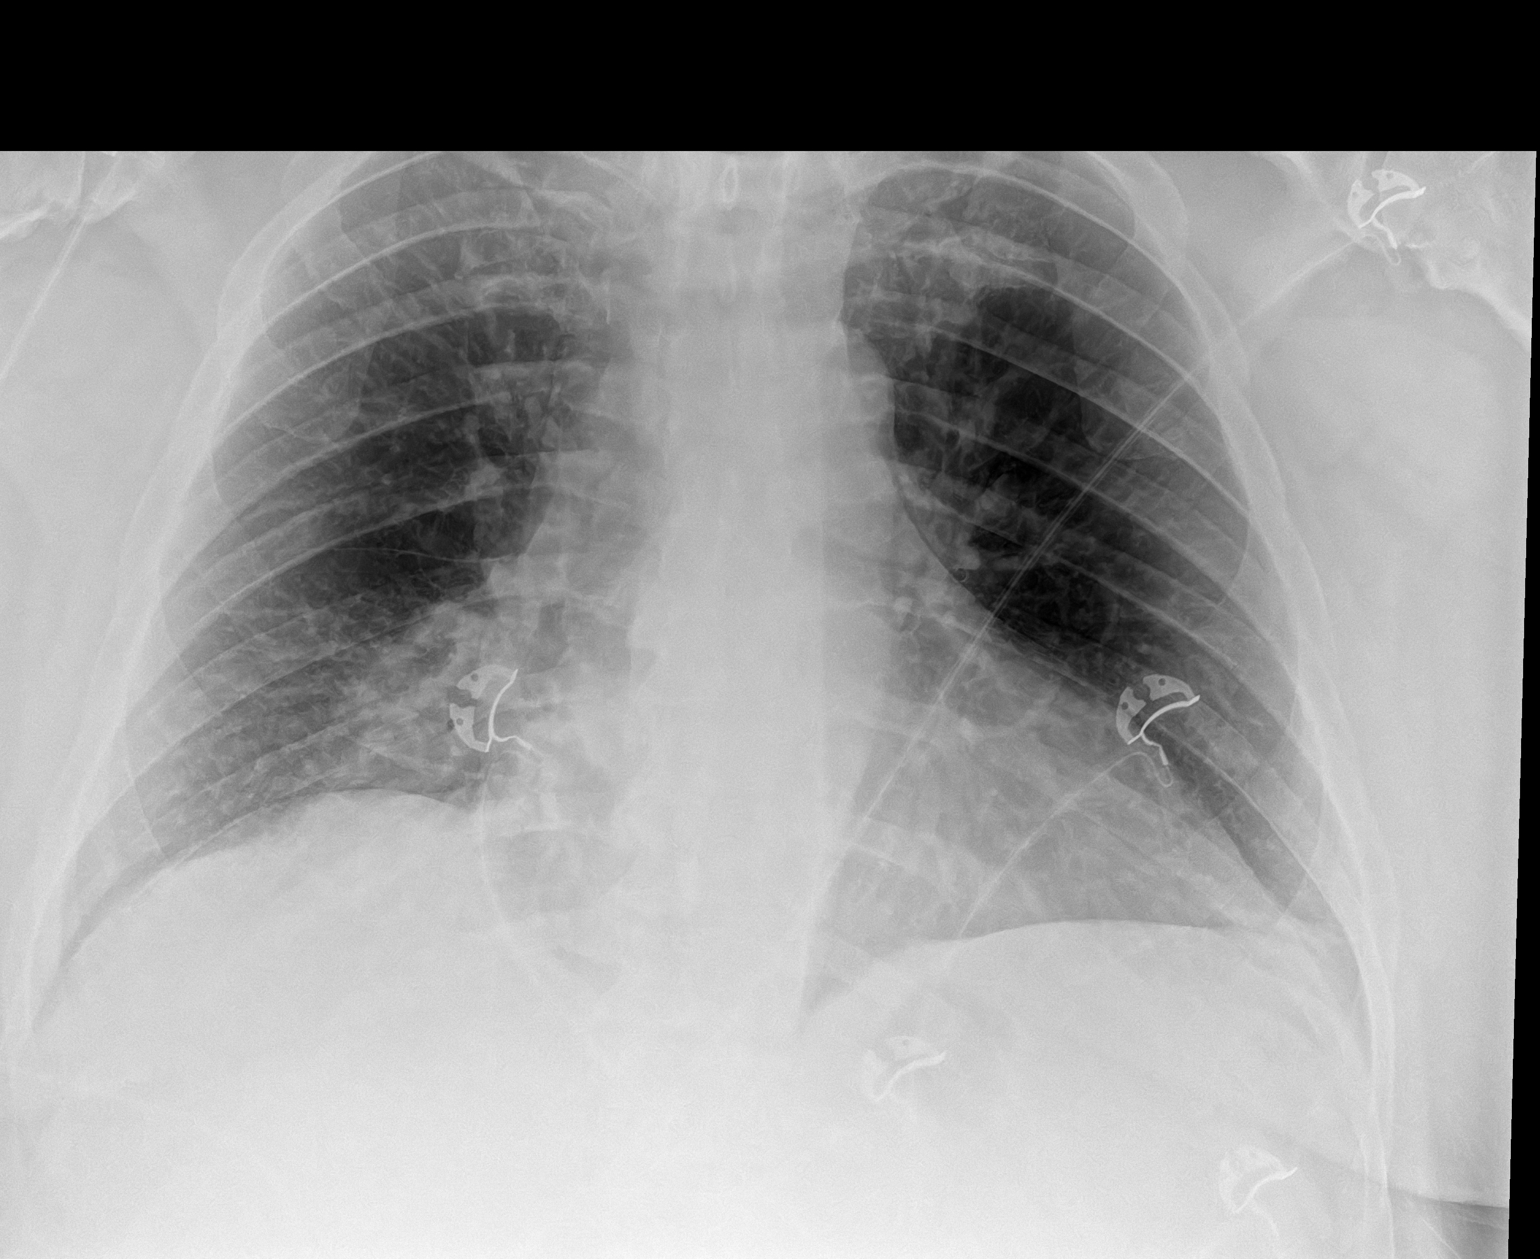

[chest lat]
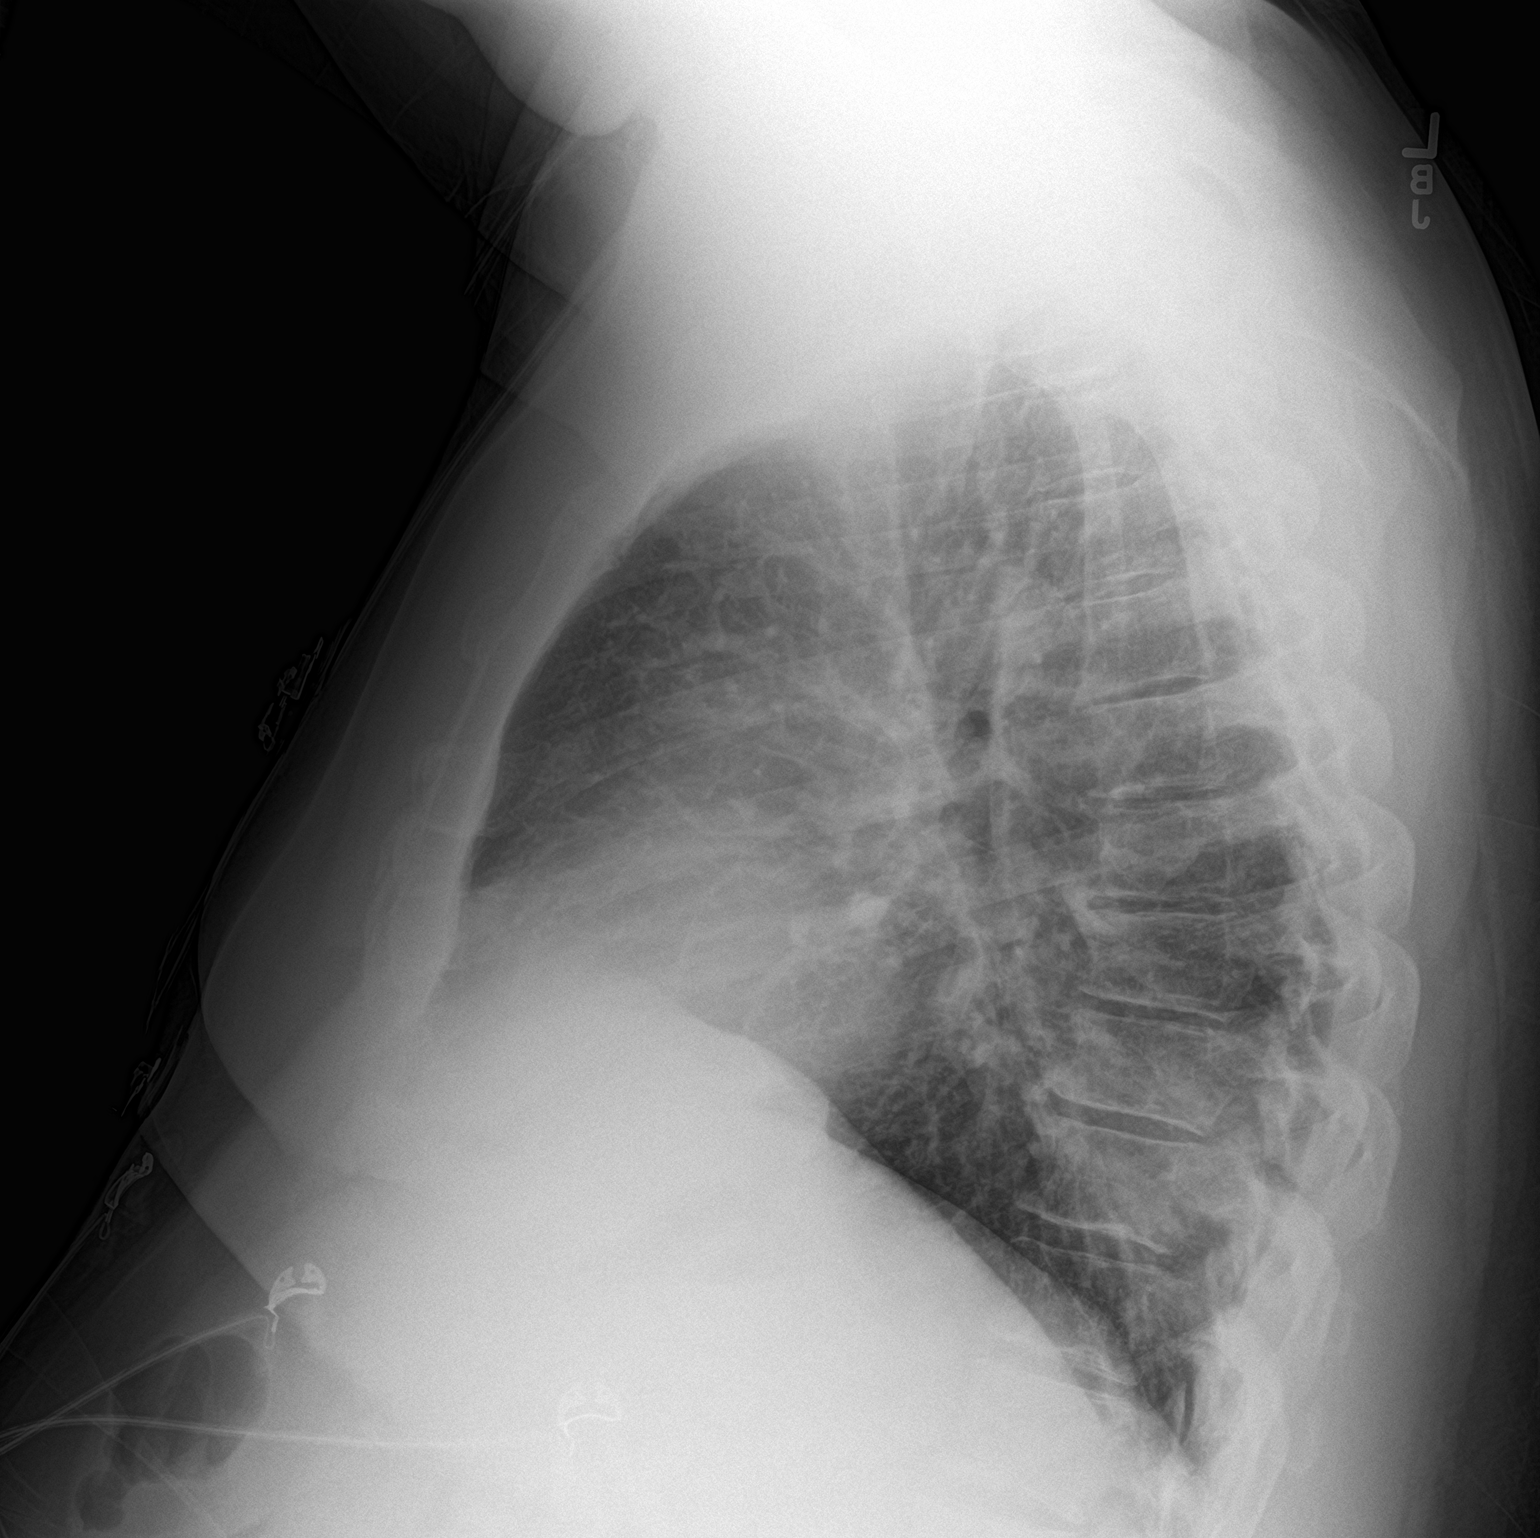

[2 of 2 positions shown; findings below may reference images not displayed]

FINDINGS: Infiltrate in the right lower lobe overlapping the lower hilum on
the frontal view but more posterior on the lateral view. No edema,
effusion, or pneumothorax. Chronic cardiac enlargement and elevated
right diaphragm.
IMPRESSION: Right lower lobe pneumonia. Followup PA and lateral chest X-ray is
recommended in 3-4 weeks following trial of antibiotic therapy to
ensure resolution.

## 2023-06-27 ENCOUNTER — Ambulatory Visit (INDEPENDENT_AMBULATORY_CARE_PROVIDER_SITE_OTHER): Payer: Self-pay | Admitting: Audiology

## 2023-06-27 DIAGNOSIS — H903 Sensorineural hearing loss, bilateral: Secondary | ICD-10-CM

## 2023-06-27 NOTE — Progress Notes (Signed)
  [address], [address] [place], [place] [address] [phone]  Hearing Aid Check     Devon Mora comes for a scheduled appointment for a hearing aid check.     Right  Hearing aid manufacturer Devon Mora [id number] [id number]  Hearing aid style Receiver in the canal  Hearing aid battery reachargeabale  Receiver   Dome/ custom earpiece   Retention wire   Warranty expiration date 06-04-2019  Initial fitting date 01-11-2018  Device was fit at: Dr. [facility] clinic   Of note: Patient has another set of M70R's that used to belong to his father. He said that Dr. Deatra Ina (previously worked in the clinic for Dr. Suszanne Conners) had programmed his father's pair for his hearing loss.   Chief complaint: Patient reports the right aid (his) is not blinking when in the charger. Also reports the battery doe snot hold charge for as long as before.   Patient was oriented about the service fees ($250-300) per device if sent for repair when out of warranty. Patient chose not to send it in.  Services fee: $0 was paid at checkout. Di dnot charge today given that no repair was completed.    Recommend: Return for a hearing aid check as needed. Return for a hearing evaluation and to see an ENT, if concerns with hearing changes arise.    Devon Mora MARIE LEROUX-MARTINEZ, AUD

## 2023-09-23 ENCOUNTER — Encounter: Payer: Self-pay | Admitting: Family Medicine

## 2023-09-25 ENCOUNTER — Other Ambulatory Visit: Payer: Self-pay

## 2023-09-25 DIAGNOSIS — I10 Essential (primary) hypertension: Secondary | ICD-10-CM

## 2023-09-25 DIAGNOSIS — Z125 Encounter for screening for malignant neoplasm of prostate: Secondary | ICD-10-CM

## 2023-09-25 DIAGNOSIS — E785 Hyperlipidemia, unspecified: Secondary | ICD-10-CM

## 2023-09-27 ENCOUNTER — Ambulatory Visit (INDEPENDENT_AMBULATORY_CARE_PROVIDER_SITE_OTHER): Payer: BC Managed Care – PPO | Admitting: Family Medicine

## 2023-09-27 VITALS — BP 132/85 | HR 67 | Temp 97.7°F | Ht 69.0 in | Wt 245.0 lb

## 2023-09-27 DIAGNOSIS — E785 Hyperlipidemia, unspecified: Secondary | ICD-10-CM | POA: Diagnosis not present

## 2023-09-27 DIAGNOSIS — I10 Essential (primary) hypertension: Secondary | ICD-10-CM | POA: Diagnosis not present

## 2023-09-27 LAB — CMP14+EGFR
ALT: 30 IU/L (ref 0–44)
AST: 25 IU/L (ref 0–40)
Albumin: 4.7 g/dL (ref 3.8–4.9)
Alkaline Phosphatase: 54 IU/L (ref 44–121)
BUN/Creatinine Ratio: 21 — ABNORMAL HIGH (ref 9–20)
BUN: 21 mg/dL (ref 6–24)
Bilirubin Total: 1.2 mg/dL (ref 0.0–1.2)
CO2: 21 mmol/L (ref 20–29)
Calcium: 9.5 mg/dL (ref 8.7–10.2)
Chloride: 105 mmol/L (ref 96–106)
Creatinine, Ser: 0.99 mg/dL (ref 0.76–1.27)
Globulin, Total: 2 g/dL (ref 1.5–4.5)
Glucose: 83 mg/dL (ref 70–99)
Potassium: 4.6 mmol/L (ref 3.5–5.2)
Sodium: 142 mmol/L (ref 134–144)
Total Protein: 6.7 g/dL (ref 6.0–8.5)
eGFR: 88 mL/min/{1.73_m2} (ref >59)

## 2023-09-27 LAB — CBC WITH DIFFERENTIAL/PLATELET
Basophils Absolute: 0.1 10*3/uL (ref 0.0–0.2)
Basos: 1 %
EOS (ABSOLUTE): 0.5 10*3/uL — ABNORMAL HIGH (ref 0.0–0.4)
Eos: 6 %
Hematocrit: 43.7 % (ref 37.5–51.0)
Hemoglobin: 14.7 g/dL (ref 13.0–17.7)
Immature Grans (Abs): 0 10*3/uL (ref 0.0–0.1)
Immature Granulocytes: 0 %
Lymphocytes Absolute: 2.4 10*3/uL (ref 0.7–3.1)
Lymphs: 32 %
MCH: 31.8 pg (ref 26.6–33.0)
MCHC: 33.6 g/dL (ref 31.5–35.7)
MCV: 95 fL (ref 79–97)
Monocytes Absolute: 0.8 10*3/uL (ref 0.1–0.9)
Monocytes: 10 %
Neutrophils Absolute: 3.8 10*3/uL (ref 1.4–7.0)
Neutrophils: 51 %
Platelets: 261 10*3/uL (ref 150–450)
RBC: 4.62 x10E6/uL (ref 4.14–5.80)
RDW: 12.4 % (ref 11.6–15.4)
WBC: 7.5 10*3/uL (ref 3.4–10.8)

## 2023-09-27 LAB — LIPID PANEL
Chol/HDL Ratio: 3.9 ratio (ref 0.0–5.0)
Cholesterol, Total: 124 mg/dL (ref 100–199)
HDL: 32 mg/dL — ABNORMAL LOW
LDL Chol Calc (NIH): 72 mg/dL (ref 0–99)
Triglycerides: 110 mg/dL (ref 0–149)
VLDL Cholesterol Cal: 20 mg/dL (ref 5–40)

## 2023-09-27 LAB — PSA: Prostate Specific Ag, Serum: 0.3 ng/mL (ref 0.0–4.0)

## 2023-09-27 MED ORDER — ENALAPRIL MALEATE 20 MG PO TABS
20.0000 mg | ORAL_TABLET | Freq: Two times a day (BID) | ORAL | 3 refills | Status: AC
Start: 2023-09-27 — End: ?

## 2023-09-27 MED ORDER — AMLODIPINE BESYLATE 5 MG PO TABS
5.0000 mg | ORAL_TABLET | Freq: Every day | ORAL | 3 refills | Status: DC
Start: 2023-09-27 — End: 2023-11-21

## 2023-09-27 MED ORDER — ATORVASTATIN CALCIUM 10 MG PO TABS: 10.0000 mg | ORAL_TABLET | Freq: Every day | ORAL | 3 refills | Status: DC

## 2023-09-27 NOTE — Assessment & Plan Note (Signed)
 Stable.  Continue current medications.  Refilled today.

## 2023-09-27 NOTE — Assessment & Plan Note (Signed)
 Stable.  Continue statin.  Refilled today.

## 2023-09-27 NOTE — Progress Notes (Signed)
 Subjective:  Patient ID: Devon Mora, male    DOB: July 03, 1965  Age: 58 y.o. MRN: [id number]  CC:   Chief Complaint  Patient presents with   6 month follow up    HPI:  58 year old male presents for follow-up.  Hypertension stable on amlodipine  and enalapril .  Patient recently had labs.  Labs reviewed today.  Lipids fairly well-controlled on Lipitor.  Patient states that dermatology has recently changed his diagnosis from psoriasis to eczema.  He is on a new injectable.  Additionally, patient states that he has started a GLP-1 medication through an online company for weight loss.  Patient Active Problem List   Diagnosis Date Noted   Obesity (BMI 30-39.9) 03/29/2023   Mild intermittent asthma 05/09/2014   Hypertension    Hyperlipidemia     Social Hx   Social History   Socioeconomic History   Marital status: Married    Spouse name: Not on file   Number of children: Not on file   Years of education: Not on file   Highest education level: 12th grade  Occupational History   Not on file  Tobacco Use   Smoking status: Never   Smokeless tobacco: Never  Substance and Sexual Activity   Alcohol use: No   Drug use: No   Sexual activity: Yes  Other Topics Concern   Not on file  Social History Narrative   Not on file   Social Drivers of Health   Financial Resource Strain: Low Risk  (09/23/2023)   Overall Financial Resource Strain (CARDIA)    Difficulty of Paying Living Expenses: Not hard at all  Food Insecurity: No Food Insecurity (09/23/2023)   Hunger Vital Sign    Worried About Running Out of Food in the Last Year: Never true    Ran Out of Food in the Last Year: Never true  Transportation Needs: No Transportation Needs (09/23/2023)   PRAPARE - Administrator, Civil Service (Medical): No    Lack of Transportation (Non-Medical): No  Physical Activity: Insufficiently Active (09/23/2023)   Exercise Vital Sign    Days of Exercise per Week: 3 days    Minutes of  Exercise per Session: 20 min  Stress: No Stress Concern Present (09/23/2023)   [employer] of Occupational Health - Occupational Stress Questionnaire    Feeling of Stress : Not at all  Social Connections: Moderately Integrated (09/23/2023)   Social Connection and Isolation Panel [NHANES]    Frequency of Communication with Friends and Family: Never    Frequency of Social Gatherings with Friends and Family: More than three times a week    Attends Religious Services: 1 to 4 times per year    Active Member of [employer] or Organizations: No    Attends Engineer, structural: Not on file    Marital Status: Married    Review of Systems  Respiratory: Negative.    Cardiovascular: Negative.    Objective:  BP 132/85   Pulse 67   Temp 97.7 F (36.5 C)   Ht 5\' 9"  (1.753 m)   Wt 245 lb (111.1 kg)   SpO2 97%   BMI 36.18 kg/m      09/27/2023   11:39 AM 09/27/2023   11:08 AM 03/29/2023    9:40 AM  BP/Weight  Systolic BP 132 140 138  Diastolic BP 85 84 64  Wt. (Lbs)  245 263  BMI  36.18 kg/m2 38.84 kg/m2    Physical Exam Vitals and nursing note  reviewed.  Constitutional:      General: He is not in acute distress.    Appearance: Normal appearance. He is obese.  HENT:     Head: Normocephalic and atraumatic.  Eyes:     General:        Right eye: No discharge.        Left eye: No discharge.     Conjunctiva/sclera: Conjunctivae normal.  Cardiovascular:     Rate and Rhythm: Normal rate and regular rhythm.  Pulmonary:     Effort: Pulmonary effort is normal.     Breath sounds: Normal breath sounds. No wheezing, rhonchi or rales.  Neurological:     Mental Status: He is alert.  Psychiatric:        Mood and Affect: Mood normal.        Behavior: Behavior normal.     Lab Results  Component Value Date   WBC 7.5 09/26/2023   HGB 14.7 09/26/2023   HCT 43.7 09/26/2023   PLT 261 09/26/2023   GLUCOSE 83 09/26/2023   CHOL 124 09/26/2023   TRIG 110 09/26/2023   HDL 32 (L)  09/26/2023   LDLCALC 72 09/26/2023   ALT 30 09/26/2023   AST 25 09/26/2023   NA 142 09/26/2023   K 4.6 09/26/2023   CL 105 09/26/2023   CREATININE 0.99 09/26/2023   BUN 21 09/26/2023   CO2 21 09/26/2023   PSA 0.21 08/06/2014   HGBA1C 5.6 04/03/2022     Assessment & Plan:  Hypertension, unspecified type Assessment & Plan: Stable. Continue current medications. Refilled today.  Orders: -     Enalapril  Maleate; Take 1 tablet (20 mg total) by mouth 2 (two) times daily.  Dispense: 180 tablet; Refill: 3 -     amLODIPine  Besylate; Take 1 tablet (5 mg total) by mouth daily.  Dispense: 90 tablet; Refill: 3  Hyperlipidemia, unspecified hyperlipidemia type Assessment & Plan: Stable.  Continue statin.  Refilled today.  Orders: -     Atorvastatin  Calcium ; Take 1 tablet (10 mg total) by mouth daily.  Dispense: 90 tablet; Refill: 3    Follow-up:  Return in about 6 months (around 03/28/2024).  Kathleen Papa DO [facility]

## 2023-09-27 NOTE — Patient Instructions (Signed)
 Labs look great.  Follow up in 6 months

## 2023-11-15 ENCOUNTER — Emergency Department (HOSPITAL_COMMUNITY)

## 2023-11-15 ENCOUNTER — Other Ambulatory Visit: Payer: Self-pay

## 2023-11-15 ENCOUNTER — Emergency Department (HOSPITAL_COMMUNITY)
Admission: EM | Admit: 2023-11-15 | Discharge: 2023-11-15 | Disposition: A | Discharge status: Home / Self Care | Attending: Emergency Medicine | Admitting: Emergency Medicine

## 2023-11-15 ENCOUNTER — Encounter (HOSPITAL_COMMUNITY): Payer: Self-pay | Admitting: *Deleted

## 2023-11-15 DIAGNOSIS — I10 Essential (primary) hypertension: Secondary | ICD-10-CM | POA: Insufficient documentation

## 2023-11-15 DIAGNOSIS — J45909 Unspecified asthma, uncomplicated: Secondary | ICD-10-CM | POA: Insufficient documentation

## 2023-11-15 DIAGNOSIS — K5732 Diverticulitis of large intestine without perforation or abscess without bleeding: Secondary | ICD-10-CM | POA: Insufficient documentation

## 2023-11-15 DIAGNOSIS — R1031 Right lower quadrant pain: Secondary | ICD-10-CM | POA: Diagnosis present

## 2023-11-15 DIAGNOSIS — D72829 Elevated white blood cell count, unspecified: Secondary | ICD-10-CM | POA: Diagnosis not present

## 2023-11-15 DIAGNOSIS — Z79899 Other long term (current) drug therapy: Secondary | ICD-10-CM | POA: Insufficient documentation

## 2023-11-15 DIAGNOSIS — K5792 Diverticulitis of intestine, part unspecified, without perforation or abscess without bleeding: Secondary | ICD-10-CM

## 2023-11-15 LAB — URINALYSIS, ROUTINE W REFLEX MICROSCOPIC
Bilirubin Urine: NEGATIVE
Glucose, UA: NEGATIVE mg/dL
Hgb urine dipstick: NEGATIVE
Ketones, ur: 20 mg/dL — AB
Leukocytes,Ua: NEGATIVE
Nitrite: NEGATIVE
Protein, ur: NEGATIVE mg/dL
Specific Gravity, Urine: 1.015 (ref 1.005–1.030)
pH: 5 (ref 5.0–8.0)

## 2023-11-15 LAB — CBC
HCT: 47 % (ref 39.0–52.0)
Hemoglobin: 17 g/dL (ref 13.0–17.0)
MCH: 32.5 pg (ref 26.0–34.0)
MCHC: 36.2 g/dL — ABNORMAL HIGH (ref 30.0–36.0)
MCV: 89.9 fL (ref 80.0–100.0)
Platelets: 289 K/uL (ref 150–400)
RBC: 5.23 MIL/uL (ref 4.22–5.81)
RDW: 12.6 % (ref 11.5–15.5)
WBC: 18.2 K/uL — ABNORMAL HIGH (ref 4.0–10.5)
nRBC: 0 % (ref 0.0–0.2)

## 2023-11-15 LAB — COMPREHENSIVE METABOLIC PANEL WITH GFR
ALT: 23 U/L (ref 0–44)
AST: 15 U/L (ref 15–41)
Albumin: 4 g/dL (ref 3.5–5.0)
Alkaline Phosphatase: 57 U/L (ref 38–126)
Anion gap: 16 — ABNORMAL HIGH (ref 5–15)
BUN: 22 mg/dL — ABNORMAL HIGH (ref 6–20)
CO2: 18 mmol/L — ABNORMAL LOW (ref 22–32)
Calcium: 8.8 mg/dL — ABNORMAL LOW (ref 8.9–10.3)
Chloride: 102 mmol/L (ref 98–111)
Creatinine, Ser: 0.99 mg/dL (ref 0.61–1.24)
GFR, Estimated: >60 mL/min (ref >60)
Glucose, Bld: 107 mg/dL — ABNORMAL HIGH (ref 70–99)
Potassium: 3.8 mmol/L (ref 3.5–5.1)
Sodium: 136 mmol/L (ref 135–145)
Total Bilirubin: 1.7 mg/dL — ABNORMAL HIGH (ref 0.0–1.2)
Total Protein: 7.1 g/dL (ref 6.5–8.1)

## 2023-11-15 LAB — LIPASE, BLOOD: Lipase: 28 U/L (ref 11–51)

## 2023-11-15 LAB — LACTIC ACID, PLASMA: Lactic Acid, Venous: 1 mmol/L (ref 0.5–1.9)

## 2023-11-15 MED ORDER — SODIUM CHLORIDE 0.9 % IV BOLUS
1000.0000 mL | Freq: Once | INTRAVENOUS | Status: AC
  Administered 2023-11-15: 1000 mL via INTRAVENOUS

## 2023-11-15 MED ORDER — MORPHINE SULFATE (PF) 4 MG/ML IV SOLN: 4.0000 mg | Freq: Once | INTRAVENOUS | Status: DC

## 2023-11-15 MED ORDER — ONDANSETRON HCL 4 MG/2ML IJ SOLN: 4.0000 mg | Freq: Once | INTRAMUSCULAR | Status: DC

## 2023-11-15 MED ORDER — AMOXICILLIN-POT CLAVULANATE 875-125 MG PO TABS
1.0000 | ORAL_TABLET | Freq: Two times a day (BID) | ORAL | 0 refills | Status: DC
Start: 2023-11-15 — End: 2023-12-25

## 2023-11-15 MED ORDER — AMOXICILLIN-POT CLAVULANATE 875-125 MG PO TABS
1.0000 | ORAL_TABLET | Freq: Once | ORAL | Status: AC
  Administered 2023-11-15: 1 via ORAL
  Filled 2023-11-15: qty 1

## 2023-11-15 MED ORDER — IOHEXOL 300 MG/ML  SOLN
100.0000 mL | Freq: Once | INTRAMUSCULAR | Status: AC | PRN
  Administered 2023-11-15: 100 mL via INTRAVENOUS

## 2023-11-15 NOTE — ED Provider Notes (Signed)
 [place] EMERGENCY DEPARTMENT AT [facility] Provider Note   CSN: [id number] Arrival date & time: 11/15/23  1700     Patient presents with: Emesis   Devon Mora is a 58 y.o. male.    Emesis Associated symptoms: abdominal pain and diarrhea   Associated symptoms: no chills, no fever and no headaches        Devon Mora is a 58 y.o. male with past medical history of of hypertension, hyperlipidemia asthma who presents to the Emergency Department complaining of right lower abdominal pain nausea vomiting diarrhea.  Symptoms present for 2 days.  Describes brown watery stools.  No black or bloody stools, symptoms also associated with decreased appetite.  He recently began a GLP-1 medication for weight loss and initially thought his symptoms may be related to the medication.  He has been taking Zofran  without relief.  Denies any fever chills chest pain or shortness of breath.  No back pain flank pain or dysuria symptoms.  Prior to Admission medications   Medication Sig Start Date End Date Taking? Authorizing Provider  acetaminophen  (TYLENOL ) 500 MG tablet Take 1,000 mg by mouth every 6 (six) hours as needed (for pain.).    [provider]  albuterol  (PROAIR  HFA) 108 (90 Base) MCG/ACT inhaler INHALE TWO PUFFS BY MOUTH EVERY 4 HOURS AS NEEDED FOR WHEEZING 09/26/21   Cook, Jayce G, DO  amLODipine  (NORVASC ) 5 MG tablet Take 1 tablet (5 mg total) by mouth daily. 09/27/23   Cook, Jayce G, DO  atorvastatin  (LIPITOR) 10 MG tablet Take 1 tablet (10 mg total) by mouth daily. 09/27/23   Cook, Jayce G, DO  enalapril  (VASOTEC ) 20 MG tablet Take 1 tablet (20 mg total) by mouth 2 (two) times daily. 09/27/23   Cook, Jayce G, DO  lebrikizumab-lbkz (EBGLYSS) 250 MG/2ML SOAJ injection Inject 250 mg as directed every 14 (fourteen) days. 04/25/23   [provider]  Multiple Vitamins-Minerals (MULTIVITAMIN WITH MINERALS) tablet Take 1 tablet by mouth daily.    [provider]   Semaglutide ,0.25 or 0.5MG /DOS, 2 MG/3ML SOPN Inject 3 mLs into the skin once a week.    [provider]    Allergies: Dust mite extract, Phentermine , and Pollen extract    Review of Systems  Constitutional:  Negative for chills and fever.  Respiratory:  Negative for shortness of breath.   Cardiovascular:  Negative for chest pain.  Gastrointestinal:  Positive for abdominal pain, diarrhea, nausea and vomiting. Negative for blood in stool.  Genitourinary:  Negative for dysuria and flank pain.  Skin:  Negative for rash.  Neurological:  Negative for dizziness, weakness, numbness and headaches.    Updated Vital Signs BP (!) 123/90 (BP Location: Right Arm)   Pulse (!) 109   Temp 99.6 F (37.6 C) (Oral)   Resp 16   Ht 5' 9 (1.753 m)   Wt 104.3 kg   SpO2 95%   BMI 33.97 kg/m   Physical Exam Vitals and nursing note reviewed.  Constitutional:      General: He is not in acute distress.    Appearance: Normal appearance. He is not toxic-appearing.  HENT:     Mouth/Throat:     Mouth: Mucous membranes are moist.  Cardiovascular:     Rate and Rhythm: Normal rate and regular rhythm.     Pulses: Normal pulses.  Pulmonary:     Effort: Pulmonary effort is normal.     Breath sounds: Normal breath sounds.  Abdominal:  General: Bowel sounds are normal.     Palpations: Abdomen is soft.     Tenderness: There is abdominal tenderness in the right lower quadrant and periumbilical area. There is no right CVA tenderness or left CVA tenderness.  Musculoskeletal:     Right lower leg: No edema.     Left lower leg: No edema.  Skin:    General: Skin is warm.     Capillary Refill: Capillary refill takes less than 2 seconds.  Neurological:     General: No focal deficit present.     Mental Status: He is alert.     Sensory: No sensory deficit.     Motor: No weakness.     (all labs ordered are listed, but only abnormal results are displayed) Labs Reviewed  COMPREHENSIVE METABOLIC  PANEL WITH GFR - Abnormal; Notable for the following components:      Result Value   CO2 18 (*)    Glucose, Bld 107 (*)    BUN 22 (*)    Calcium  8.8 (*)    Total Bilirubin 1.7 (*)    Anion gap 16 (*)    All other components within normal limits  CBC - Abnormal; Notable for the following components:   WBC 18.2 (*)    MCHC 36.2 (*)    All other components within normal limits  CULTURE, BLOOD (SINGLE)  LIPASE, BLOOD  LACTIC ACID, PLASMA  URINALYSIS, ROUTINE W REFLEX MICROSCOPIC    EKG: None  Radiology: CT ABDOMEN PELVIS W CONTRAST Result Date: 11/15/2023 EXAM: CT ABDOMEN AND PELVIS WITH CONTRAST 11/15/2023 08:37:38 PM TECHNIQUE: CT of the abdomen and pelvis was performed with the administration of intravenous contrast. Multiplanar reformatted images are provided for review. Automated exposure control, iterative reconstruction, and/or weight based adjustment of the mA/kV was utilized to reduce the radiation dose to as low as reasonably achievable. COMPARISON: None available. CLINICAL HISTORY: RLQ abdominal pain. Pt with abd pain with N/V/D since Tuesday night. Has tried imodium and nausea medication without relief. FINDINGS: LOWER CHEST: No acute abnormality. LIVER: The liver is unremarkable. GALLBLADDER AND BILE DUCTS: Layering tiny gallstones (image 33), without associated inflammatory changes. No biliary ductal dilatation. SPLEEN: No acute abnormality. PANCREAS: No acute abnormality. ADRENAL GLANDS: No acute abnormality. KIDNEYS, URETERS AND BLADDER: No stones in the kidneys or ureters. No hydronephrosis. No perinephric or periureteral stranding. Urinary bladder is unremarkable. GI AND BOWEL: Normal appendix (image 87). Left colonic diverticulosis, with very mild pericolonic stranding along the proximal sigmoid colon (image 71), suggesting mild sigmoid diverticulitis. No drainable fluid collection/abscess. No free air. PERITONEUM AND RETROPERITONEUM: No ascites. No free air. VASCULATURE: Aorta  is normal in caliber. LYMPH NODES: No lymphadenopathy. REPRODUCTIVE ORGANS: Prostate is unremarkable. BONES AND SOFT TISSUES: Degenerative changes of the visualized thoracolumbar spine. No acute osseous abnormality. No focal soft tissue abnormality. IMPRESSION: 1. Mild sigmoid diverticulitis. No drainable fluid collection or free air. 2. Layering tiny gallstones, without associated inflammatory changes. Electronically signed by: Pinkie Pebbles MD 11/15/2023 08:45 PM EDT RP Workstation: [id number]     Procedures   Medications Ordered in the ED  amoxicillin -clavulanate (AUGMENTIN ) 875-125 MG per tablet 1 tablet (has no administration in time range)  sodium chloride  0.9 % bolus 1,000 mL (0 mLs Intravenous Stopped 11/15/23 2116)  iohexol  (OMNIPAQUE ) 300 MG/ML solution 100 mL (100 mLs Intravenous Contrast Given 11/15/23 2026)  Medical Decision Making Patient here with 2-day history of nausea vomiting diarrhea and right lower quadrant/periumbilical pain.  Taking antiemetic on as-needed basis.  No fever or chills.  Describes watery stools but taking over-the-counter Imodium.  No black or bloody stools denies any dysuria or flank pain.  Patient does have right lower quadrant tenderness on my exam.  His abdomen is soft and there is no guarding or rebound tenderness.  Mucous membranes are moist and he is nontoxic-appearing.  Differential would include but not limited to acute appendicitis, gastroenteritis, viral process, SBO, kidney stone, diverticulitis  Amount and/or Complexity of Data Reviewed Labs: ordered.    Details: Leukocytosis with white count of 18,000, lipase unremarkable, lactic acid unremarkable, urinalysis without evidence of hematuria or infection Radiology: ordered.    Details: CT abdomen and pelvis shows mild sigmoid diverticulitis without perforation or abscess Discussion of management or test interpretation with external provider(s): Patient has  received IV fluids, pain medication and antiemetic was offered but patient declined stated that he preferred not to take pain medication at this time and had taken antiemetic prior to arrival.  Workup this evening shows diverticulitis without abscess or perforation, patient is well-appearing.  No peritoneal signs on exam.  Will initiate therapy with Augmentin  here and prescription for same written.  Patient reports colonoscopy several years ago.  I have discussed importance of close outpatient follow-up with GI as he may need repeat colonoscopy in the near future.  Patient agreeable to plan, appears appropriate for discharge home, strict return precautions were also given.  Risk Prescription drug management.        Final diagnoses:  Diverticulitis    ED Discharge Orders     None          Herlinda Milling, PA-C 11/16/23 1330    Devon Ozell BROCKS, MD 11/17/23 [phone]

## 2023-11-15 NOTE — Discharge Instructions (Addendum)
 Your CT scan this evening shows that you have diverticulitis.  Please take the antibiotic as directed until finished.  Please contact your GI provider to arrange follow-up appointment if needed.  Return to emergency department if you develop any new or worsening symptoms

## 2023-11-15 NOTE — ED Notes (Signed)
 Pt/family received d/c paperwork at this time. After going over the paperwork any questions, comments, or concerns were answered to the best of this nurse's knowledge. The pt/family verbally acknowledged the teachings/instructions.

## 2023-11-15 NOTE — ED Triage Notes (Signed)
 Pt with abd pain with N/V/D since Tuesday night. Has tried imodium and nausea medication without relief.

## 2023-11-20 LAB — CULTURE, BLOOD (SINGLE)
Culture: NO GROWTH
Special Requests: ADEQUATE

## 2023-11-21 ENCOUNTER — Ambulatory Visit: Admitting: Family Medicine

## 2023-11-21 VITALS — BP 112/70 | HR 69 | Temp 97.9°F | Ht 69.0 in | Wt 232.8 lb

## 2023-11-21 DIAGNOSIS — K5792 Diverticulitis of intestine, part unspecified, without perforation or abscess without bleeding: Secondary | ICD-10-CM | POA: Diagnosis not present

## 2023-11-21 DIAGNOSIS — I10 Essential (primary) hypertension: Secondary | ICD-10-CM | POA: Diagnosis not present

## 2023-11-21 NOTE — Assessment & Plan Note (Signed)
 BP 112/70 today. Stopping Amlodipine .

## 2023-11-21 NOTE — Patient Instructions (Signed)
 Stop the amlodipine .  Healthy diet.  Take care  Dr. Bluford

## 2023-11-21 NOTE — Assessment & Plan Note (Signed)
Doing well.   Complete antibiotics.

## 2023-11-21 NOTE — Progress Notes (Signed)
 Subjective:  Patient ID: Devon Mora, male    DOB: 1965-07-07  Age: 58 y.o. MRN: [id number]  CC:  Follow up Diverticulitis   HPI:  58 year old male presents for follow up.  Recent ED visit. Found to have diverticulitis. This is his first occurrence. Treated with Augmentin .    He states that he is doing well. No current abdominal pain. He is doing well at this time. Still on antibiotics.    He report that his BP has been well controlled and on the low side. He would like to discuss discontinuing Amlodipine .   Patient Active Problem List   Diagnosis Date Noted   Diverticulitis 11/21/2023   Obesity (BMI 30-39.9) 03/29/2023   Mild intermittent asthma 05/09/2014   Hypertension    Hyperlipidemia     Social Hx   Social History   Socioeconomic History   Marital status: Married    Spouse name: Not on file   Number of children: Not on file   Years of education: Not on file   Highest education level: 12th grade  Occupational History   Not on file  Tobacco Use   Smoking status: Never   Smokeless tobacco: Never  Substance and Sexual Activity   Alcohol use: No   Drug use: No   Sexual activity: Yes  Other Topics Concern   Not on file  Social History Narrative   Not on file   Social Drivers of Health   Financial Resource Strain: Low Risk  (09/23/2023)   Overall Financial Resource Strain (CARDIA)    Difficulty of Paying Living Expenses: Not hard at all  Food Insecurity: No Food Insecurity (09/23/2023)   Hunger Vital Sign    Worried About Running Out of Food in the Last Year: Never true    Ran Out of Food in the Last Year: Never true  Transportation Needs: No Transportation Needs (09/23/2023)   PRAPARE - Administrator, Civil Service (Medical): No    Lack of Transportation (Non-Medical): No  Physical Activity: Insufficiently Active (09/23/2023)   Exercise Vital Sign    Days of Exercise per Week: 3 days    Minutes of Exercise per Session: 20 min  Stress: No  Stress Concern Present (09/23/2023)   [employer] of Occupational Health - Occupational Stress Questionnaire    Feeling of Stress : Not at all  Social Connections: Moderately Integrated (09/23/2023)   Social Connection and Isolation Panel    Frequency of Communication with Friends and Family: Never    Frequency of Social Gatherings with Friends and Family: More than three times a week    Attends Religious Services: 1 to 4 times per year    Active Member of [employer] or Organizations: No    Attends Engineer, structural: Not on file    Marital Status: Married    Review of Systems Per HPI  Objective:  BP 112/70   Pulse 69   Temp 97.9 F (36.6 C)   Ht 5' 9 (1.753 m)   Wt 232 lb 12.8 oz (105.6 kg)   SpO2 98%   BMI 34.38 kg/m      11/21/2023    9:56 AM 11/15/2023   10:00 PM 11/15/2023    9:35 PM  BP/Weight  Systolic BP 112 132 119  Diastolic BP 70 84 83  Wt. (Lbs) 232.8    BMI 34.38 kg/m2      Physical Exam Vitals and nursing note reviewed.  Constitutional:  General: He is not in acute distress.    Appearance: Normal appearance.  HENT:     Head: Normocephalic and atraumatic.  Cardiovascular:     Rate and Rhythm: Normal rate and regular rhythm.  Pulmonary:     Effort: Pulmonary effort is normal.     Breath sounds: Normal breath sounds.  Abdominal:     General: There is no distension.     Palpations: Abdomen is soft.     Tenderness: There is no abdominal tenderness.  Neurological:     Mental Status: He is alert.     Lab Results  Component Value Date   WBC 18.2 (H) 11/15/2023   HGB 17.0 11/15/2023   HCT 47.0 11/15/2023   PLT 289 11/15/2023   GLUCOSE 107 (H) 11/15/2023   CHOL 124 09/26/2023   TRIG 110 09/26/2023   HDL 32 (L) 09/26/2023   LDLCALC 72 09/26/2023   ALT 23 11/15/2023   AST 15 11/15/2023   NA 136 11/15/2023   K 3.8 11/15/2023   CL 102 11/15/2023   CREATININE 0.99 11/15/2023   BUN 22 (H) 11/15/2023   CO2 18 (L) 11/15/2023   PSA  0.21 08/06/2014   HGBA1C 5.6 04/03/2022     Assessment & Plan:  Diverticulitis Assessment & Plan: Doing well. Complete antibiotics.   Hypertension, unspecified type Assessment & Plan: BP 112/70 today. Stopping Amlodipine .     Follow-up:  Return if symptoms worsen or fail to improve.  Jacqulyn Ahle DO [facility]

## 2023-12-24 ENCOUNTER — Encounter: Payer: Self-pay | Admitting: Family Medicine

## 2023-12-25 ENCOUNTER — Other Ambulatory Visit: Payer: Self-pay | Admitting: Family Medicine

## 2023-12-25 MED ORDER — AMOXICILLIN-POT CLAVULANATE 875-125 MG PO TABS: 1.0000 | ORAL_TABLET | Freq: Two times a day (BID) | ORAL | 0 refills | Status: DC

## 2024-02-25 ENCOUNTER — Ambulatory Visit (INDEPENDENT_AMBULATORY_CARE_PROVIDER_SITE_OTHER): Payer: Self-pay | Admitting: Audiology

## 2024-02-25 ENCOUNTER — Telehealth (INDEPENDENT_AMBULATORY_CARE_PROVIDER_SITE_OTHER): Payer: Self-pay | Admitting: Audiology

## 2024-02-25 DIAGNOSIS — H9193 Unspecified hearing loss, bilateral: Secondary | ICD-10-CM

## 2024-02-25 NOTE — Telephone Encounter (Signed)
 Called patient to let him know that the repair would cost $110.00 and asked if he wanted us  to proceed.  He agreed.  Also let patient know that it would be a couple of days due to having to order the part.  Patient was okay with this.  We will call patient when HA is ready to be picked-up.

## 2024-02-25 NOTE — Progress Notes (Signed)
  [address], [address] [place], [place] [address] [phone]  Hearing Aid Check     LOUIE FLENNER walked in to have the left aid checked.       Right Left  Hearing aid manufacturer Renaldo Kirschner F29M SN: [id number] Renaldo Kirschner M50R SN: [id number]  Hearing aid style Receiver in the canal Receiver in the canal  Hearing aid battery rechargeable rechargeable  Receiver  76M  Dome/ custom earpiece  Small vented dome  Retention wire  yes  Warranty expiration date 04-02-2020 04-28-2023  Loss and Damage expired expired  Additional accessories Expiration date    Initial fitting date 01-11-2017 unknown  [facility]: [facility] (may have been passed on to him from a family member)    Chief complaint: Patient reports left aid is not working because the receiver wire broke.  Actions taken: Inspection of the device showed he needs a new left receiver. Not available in stock. Order was placed after he agreed to pay $110 to Ms. Donald over the phone .  Services fee: $0 was paid at checkout.  Patient was oriented about waiting for us  to call him to pick up the aid and pay $110.  Recommend: Return for a hearing aid check , as needed. Return for a hearing evaluation and to see an ENT, if concerns with hearing changes arise.  Renu Asby MARIE LEROUX-MARTINEZ, AUD

## 2024-02-29 ENCOUNTER — Telehealth (INDEPENDENT_AMBULATORY_CARE_PROVIDER_SITE_OTHER): Payer: Self-pay | Admitting: Audiology

## 2024-02-29 NOTE — Telephone Encounter (Signed)
 Patient called to check on hearing aid repair.  He was told they would be ready today.  I explained that the wire was ordered on 02/25/2024 and we would let him know once they were ready for pickup. I also let him know that there would be $110 due at pickup.  He would like to know if you have an estimated date they would be ready.  He can be reached at [phone].

## 2024-03-03 ENCOUNTER — Ambulatory Visit (INDEPENDENT_AMBULATORY_CARE_PROVIDER_SITE_OTHER): Payer: Self-pay | Admitting: Audiology

## 2024-03-03 ENCOUNTER — Telehealth (INDEPENDENT_AMBULATORY_CARE_PROVIDER_SITE_OTHER): Payer: Self-pay | Admitting: Audiology

## 2024-03-03 DIAGNOSIS — H903 Sensorineural hearing loss, bilateral: Secondary | ICD-10-CM

## 2024-03-04 NOTE — Progress Notes (Signed)
  [address] [place], [place] [address] [phone]  Hearing Aid Check     Devon Mora walked in top pick up his left aid after it was repaired.       Right Left  Hearing aid manufacturer Renaldo Kirschner F29M SN: [id number] Renaldo Kirschner M50R SN: [id number]  Hearing aid style Receiver in the canal Receiver in the canal  Hearing aid battery rechargeable rechargeable  Receiver   64M  Dome/ custom earpiece   Small vented dome  Retention wire   yes  Warranty expiration date 04-02-2020 04-28-2023  Loss and Damage expired expired  Additional accessories Expiration date      Initial fitting date 01-11-2017 unknown  Device was fit at: [facility] (may have been passed on to him from a family member)       Actions taken: A new left receiver wire was attached to the patient's hearing aid.  The patient mentioned he may return to have the volume on his left aid adjusted if he is unable to do so using the app. Unfortunately, my schedule did not allow for an immediate follow-up, and the patient was unable to wait.Assistance was provided by Randine Glaze.   Services fee: $110 was paid at checkout.  Recommend: Return for a hearing aid check , as needed. Return for a hearing evaluation and to see an ENT, if concerns with hearing changes arise.    Merelin Human MARIE LEROUX-MARTINEZ, AUD

## 2024-03-04 NOTE — Telephone Encounter (Signed)
 error

## 2024-03-28 ENCOUNTER — Ambulatory Visit: Admitting: Family Medicine

## 2024-04-03 ENCOUNTER — Encounter: Payer: Self-pay | Admitting: Family Medicine

## 2024-04-03 ENCOUNTER — Ambulatory Visit: Admitting: Family Medicine

## 2024-04-03 VITALS — BP 142/92 | HR 93 | Temp 99.0°F | Ht 69.0 in | Wt 243.1 lb

## 2024-04-03 DIAGNOSIS — Z8719 Personal history of other diseases of the digestive system: Secondary | ICD-10-CM | POA: Insufficient documentation

## 2024-04-03 DIAGNOSIS — E785 Hyperlipidemia, unspecified: Secondary | ICD-10-CM

## 2024-04-03 DIAGNOSIS — I10 Essential (primary) hypertension: Secondary | ICD-10-CM

## 2024-04-03 MED ORDER — ATORVASTATIN CALCIUM 10 MG PO TABS: 10.0000 mg | ORAL_TABLET | Freq: Every day | ORAL | 3 refills | Status: AC

## 2024-04-03 NOTE — Patient Instructions (Signed)
Continue your meds.  Follow up in 6 months.

## 2024-04-04 NOTE — Progress Notes (Unsigned)
 Subjective:  Patient ID: Devon Mora, male    DOB: 06/07/1965  Age: 58 y.o. MRN: [id number]  CC:   Chief Complaint  Patient presents with   Follow-up    Patient is here for a 6 month follow up    HPI:  58 year old male presents for follow-up.    Patient Active Problem List   Diagnosis Date Noted   History of diverticulitis 04/03/2024   Obesity (BMI 30-39.9) 03/29/2023   Mild intermittent asthma 05/09/2014   Hypertension    Hyperlipidemia     Social Hx   Social History   Socioeconomic History   Marital status: Married    Spouse name: Not on file   Number of children: Not on file   Years of education: Not on file   Highest education level: 12th grade  Occupational History   Not on file  Tobacco Use   Smoking status: Never   Smokeless tobacco: Never  Substance and Sexual Activity   Alcohol use: No   Drug use: No   Sexual activity: Yes  Other Topics Concern   Not on file  Social History Narrative   Not on file   Social Drivers of Health   Tobacco Use: Low Risk (04/03/2024)   Patient History    Smoking Tobacco Use: Never    Smokeless Tobacco Use: Never    Passive Exposure: Not on file  Financial Resource Strain: Low Risk (09/23/2023)   Overall Financial Resource Strain (CARDIA)    Difficulty of Paying Living Expenses: Not hard at all  Food Insecurity: No Food Insecurity (09/23/2023)   Hunger Vital Sign    Worried About Running Out of Food in the Last Year: Never true    Ran Out of Food in the Last Year: Never true  Transportation Needs: No Transportation Needs (09/23/2023)   PRAPARE - Administrator, Civil Service (Medical): No    Lack of Transportation (Non-Medical): No  Physical Activity: Insufficiently Active (09/23/2023)   Exercise Vital Sign    Days of Exercise per Week: 3 days    Minutes of Exercise per Session: 20 min  Stress: No Stress Concern Present (09/23/2023)   [employer] of Occupational Health -  Occupational Stress Questionnaire    Feeling of Stress : Not at all  Social Connections: Moderately Integrated (09/23/2023)   Social Connection and Isolation Panel    Frequency of Communication with Friends and Family: Never    Frequency of Social Gatherings with Friends and Family: More than three times a week    Attends Religious Services: 1 to 4 times per year    Active Member of [employer] or Organizations: No    Attends Banker Meetings: Not on file    Marital Status: Married  Depression (PHQ2-9): Low Risk (04/03/2024)   Depression (PHQ2-9)    PHQ-2 Score: 0  Alcohol Screen: Not on file  Housing: Unknown (09/23/2023)   Housing Stability Vital Sign    Unable to Pay for Housing in the Last Year: No    Number of Times Moved in the Last Year: Not on file    Homeless in the Last Year: No  Utilities: Not on file  Health Literacy: Not on file    Review of Systems Per HPI  Objective:  BP (!) 142/92 (BP Location: Left Arm, Cuff Size: Large)   Pulse 93   Temp 99 F (37.2 C)   Ht 5' 9 (1.753 m)   Wt 243 lb 2 oz (110.3  kg)   SpO2 96%   BMI 35.90 kg/m      04/03/2024    1:43 PM 04/03/2024    1:15 PM 11/21/2023    9:56 AM  BP/Weight  Systolic BP 142 170 112  Diastolic BP 92 96 70  Wt. (Lbs)  243.13 232.8  BMI  35.9 kg/m2 34.38 kg/m2    Physical Exam  Lab Results  Component Value Date   WBC 18.2 (H) 11/15/2023   HGB 17.0 11/15/2023   HCT 47.0 11/15/2023   PLT 289 11/15/2023   GLUCOSE 107 (H) 11/15/2023   CHOL 124 09/26/2023   TRIG 110 09/26/2023   HDL 32 (L) 09/26/2023   LDLCALC 72 09/26/2023   ALT 23 11/15/2023   AST 15 11/15/2023   NA 136 11/15/2023   K 3.8 11/15/2023   CL 102 11/15/2023   CREATININE 0.99 11/15/2023   BUN 22 (H) 11/15/2023   CO2 18 (L) 11/15/2023   PSA 0.21 08/06/2014   HGBA1C 5.6 04/03/2022     Assessment & Plan:  History of diverticulitis  Hyperlipidemia, unspecified hyperlipidemia type -     Atorvastatin   Calcium ; Take 1 tablet (10 mg total) by mouth daily.  Dispense: 90 tablet; Refill: 3    Follow-up:  No follow-ups on file.  Jacqulyn Ahle DO [facility]

## 2024-04-06 NOTE — Assessment & Plan Note (Signed)
 Stable on Lipitor. Refilled.

## 2024-04-06 NOTE — Assessment & Plan Note (Signed)
 No changes today. Monitor BP at home. Continue Enalapril .

## 2024-05-10 ENCOUNTER — Telehealth: Admitting: Physician Assistant

## 2024-05-10 DIAGNOSIS — J329 Chronic sinusitis, unspecified: Secondary | ICD-10-CM

## 2024-05-10 MED ORDER — FLUTICASONE PROPIONATE 50 MCG/ACT NA SUSP: 2.0000 | Freq: Every day | NASAL | 6 refills | Status: AC

## 2024-05-10 MED ORDER — AMOXICILLIN-POT CLAVULANATE 875-125 MG PO TABS: 1.0000 | ORAL_TABLET | Freq: Two times a day (BID) | ORAL | 0 refills | Status: AC

## 2024-05-10 MED ORDER — LEVOCETIRIZINE DIHYDROCHLORIDE 5 MG PO TABS: 5.0000 mg | ORAL_TABLET | Freq: Every evening | ORAL | 0 refills | Status: AC

## 2024-05-10 NOTE — Progress Notes (Signed)
 E-Visit for Sinus Problems  We are sorry that you are not feeling well.  Here is how we plan to help!  Based on what you have shared with me it looks like you have sinusitis.  Sinusitis is inflammation and infection in the sinus cavities of the head.  Based on your presentation I believe you most likely have Acute Bacterial Sinusitis.  This is an infection caused by bacteria and is treated with antibiotics. I have prescribed Augmentin  875mg /125mg  one tablet twice daily with food, for 7 days., I have also prescribed Flonase  Nasal Spray Use 2 sprays in each nostril daily for 10-14 days, and Levocetirizine 10mg  Take 1 tablet at bedtime You may use an oral decongestant such as Mucinex D or if you have glaucoma or high blood pressure use plain Mucinex. Saline nasal spray help and can safely be used as often as needed for congestion.  If you develop worsening sinus pain, fever or notice severe headache and vision changes, or if symptoms are not better after completion of antibiotic, please schedule an appointment with a health care provider.    Sinus infections are not as easily transmitted as other respiratory infection, however we still recommend that you avoid close contact with loved ones, especially the very young and elderly.  Remember to wash your hands thoroughly throughout the day as this is the number one way to prevent the spread of infection!  Home Care: Only take medications as instructed by your medical team. Complete the entire course of an antibiotic. Do not take these medications with alcohol. A steam or ultrasonic humidifier can help congestion.  You can place a towel over your head and breathe in the steam from hot water coming from a faucet. Avoid close contacts especially the very young and the elderly. Cover your mouth when you cough or sneeze. Always remember to wash your hands.  Get Help Right Away If: You develop worsening fever or sinus pain. You develop a severe head ache  or visual changes. Your symptoms persist after you have completed your treatment plan.  Make sure you Understand these instructions. Will watch your condition. Will get help right away if you are not doing well or get worse.  Your e-visit answers were reviewed by a board certified advanced clinical practitioner to complete your personal care plan.  Depending on the condition, your plan could have included both over the counter or prescription medications.  If there is a problem please reply  once you have received a response from your provider.  Your safety is important to us .  If you have drug allergies check your prescription carefully.    You can use MyChart to ask questions about today's visit, request a non-urgent call back, or ask for a work or school excuse for 24 hours related to this e-Visit. If it has been greater than 24 hours you will need to follow up with your provider, or enter a new e-Visit to address those concerns.  You will get an e-mail in the next two days asking about your experience.  I hope that your e-visit has been valuable and will speed your recovery. Thank you for using e-visits.  I have spent 5 minutes in review of e-visit questionnaire, review and updating patient chart, medical decision making and response to patient.   Teena Shuck, PA-C

## 2024-05-10 NOTE — Addendum Note (Signed)
 Addended byBETHA ROLAN BERTHOLD on: 05/10/2024 09:11 AM   Modules accepted: Orders

## 2024-05-21 ENCOUNTER — Ambulatory Visit (HOSPITAL_COMMUNITY)
Admission: RE | Admit: 2024-05-21 | Discharge: 2024-05-21 | Disposition: A | Discharge status: Home / Self Care | Source: Ambulatory Visit | Attending: Family Medicine | Admitting: Family Medicine

## 2024-05-21 ENCOUNTER — Ambulatory Visit: Admitting: Family Medicine

## 2024-05-21 VITALS — BP 173/92 | HR 104 | Ht 69.0 in | Wt 246.2 lb

## 2024-05-21 DIAGNOSIS — R051 Acute cough: Secondary | ICD-10-CM

## 2024-05-21 DIAGNOSIS — R509 Fever, unspecified: Secondary | ICD-10-CM | POA: Diagnosis not present

## 2024-05-21 MED ORDER — PREDNISONE 50 MG PO TABS: 50.0000 mg | ORAL_TABLET | Freq: Every day | ORAL | 0 refills | Status: AC

## 2024-05-21 MED ORDER — OSELTAMIVIR PHOSPHATE 75 MG PO CAPS: 75.0000 mg | ORAL_CAPSULE | Freq: Two times a day (BID) | ORAL | 0 refills | Status: AC

## 2024-05-21 NOTE — Progress Notes (Signed)
 "  Subjective:  Patient ID: Devon Mora, male    DOB: 18-Oct-1965  Age: 59 y.o. MRN: [id number]  CC:   Chief Complaint  Patient presents with   Cough    Patient was seen via e-visit last week for cough/head congestion, received amoxicillin  and completed course but cough has not improved, states he coughs every other breath and feels like its in his chest.    HPI:  59 year old male presents evaluation of the above.  Patient reports that he has recently been sick.  Was treated for sinusitis on 1/17.  Completed antibiotic course.  He states that over the weekend cough worsened.  He has had fever as well.  Currently febrile.  He states that he is having abnormal breath sounds night.  He states that he hears crackles when he breathes at night.  I suspect that this is secondary to wheezing.  He feels overall poor today.  He has used his inhaler without resolution.  Patient Active Problem List   Diagnosis Date Noted   Acute febrile illness 05/21/2024   History of diverticulitis 04/03/2024   Obesity (BMI 30-39.9) 03/29/2023   Mild intermittent asthma 05/09/2014   Hypertension    Hyperlipidemia     Social Hx   Social History   Socioeconomic History   Marital status: Married    Spouse name: Not on file   Number of children: Not on file   Years of education: Not on file   Highest education level: 12th grade  Occupational History   Not on file  Tobacco Use   Smoking status: Never   Smokeless tobacco: Never  Substance and Sexual Activity   Alcohol use: No   Drug use: No   Sexual activity: Yes  Other Topics Concern   Not on file  Social History Narrative   Not on file   Social Drivers of Health   Tobacco Use: Low Risk (05/21/2024)   Patient History    Smoking Tobacco Use: Never    Smokeless Tobacco Use: Never    Passive Exposure: Not on file  Financial Resource Strain: Low Risk (05/21/2024)   Overall Financial Resource Strain (CARDIA)    Difficulty of Paying Living  Expenses: Not hard at all  Food Insecurity: No Food Insecurity (05/21/2024)   Epic    Worried About Radiation Protection Practitioner of Food in the Last Year: Never true    Ran Out of Food in the Last Year: Never true  Transportation Needs: No Transportation Needs (05/21/2024)   Epic    Lack of Transportation (Medical): No    Lack of Transportation (Non-Medical): No  Physical Activity: Insufficiently Active (05/21/2024)   Exercise Vital Sign    Days of Exercise per Week: 4 days    Minutes of Exercise per Session: 20 min  Stress: No Stress Concern Present (05/21/2024)   [employer] of Occupational Health - Occupational Stress Questionnaire    Feeling of Stress: Not at all  Social Connections: Moderately Isolated (05/21/2024)   Social Connection and Isolation Panel    Frequency of Communication with Friends and Family: Twice a week    Frequency of Social Gatherings with Friends and Family: Twice a week    Attends Religious Services: Patient declined    Database Administrator or Organizations: No    Attends Banker Meetings: Not on file    Marital Status: Married  Depression (PHQ2-9): Low Risk (04/03/2024)   Depression (PHQ2-9)    PHQ-2 Score: 0  Alcohol Screen: Not  on file  Housing: Unknown (05/21/2024)   Epic    Unable to Pay for Housing in the Last Year: No    Number of Times Moved in the Last Year: Not on file    Homeless in the Last Year: No  Utilities: Not on file  Health Literacy: Not on file    Review of Systems Per HPI  Objective:  BP (!) 173/92   Pulse (!) 104   Ht 5' 9 (1.753 m)   Wt 246 lb 3.2 oz (111.7 kg)   SpO2 94%   BMI 36.36 kg/m      05/21/2024    1:10 PM 04/03/2024    1:43 PM 04/03/2024    1:15 PM  BP/Weight  Systolic BP 173 142 170  Diastolic BP 92 92 96  Wt. (Lbs) 246.2  243.13  BMI 36.36 kg/m2  35.9 kg/m2    Physical Exam Vitals and nursing note reviewed.  Constitutional:      General: He is not in acute distress.    Appearance: He is obese.   HENT:     Head: Normocephalic and atraumatic.     Nose: Nose normal.  Eyes:     Conjunctiva/sclera: Conjunctivae normal.  Cardiovascular:     Rate and Rhythm: Tachycardia present.  Pulmonary:     Effort: Pulmonary effort is normal.     Comments: Diffuse wheezing. Neurological:     Mental Status: He is alert.     Lab Results  Component Value Date   WBC 18.2 (H) 11/15/2023   HGB 17.0 11/15/2023   HCT 47.0 11/15/2023   PLT 289 11/15/2023   GLUCOSE 107 (H) 11/15/2023   CHOL 124 09/26/2023   TRIG 110 09/26/2023   HDL 32 (L) 09/26/2023   LDLCALC 72 09/26/2023   ALT 23 11/15/2023   AST 15 11/15/2023   NA 136 11/15/2023   K 3.8 11/15/2023   CL 102 11/15/2023   CREATININE 0.99 11/15/2023   BUN 22 (H) 11/15/2023   CO2 18 (L) 11/15/2023   PSA 0.21 08/06/2014   HGBA1C 5.6 04/03/2022     Assessment & Plan:  Acute febrile illness Assessment & Plan: Patient with acute cough, wheezing, fever.  Chest x-ray was obtained and I independently reviewed the x-ray and did not appreciate an infiltrate.  Given fever and the amount of influenza that is going around community, I am cover him empirically with Tamiflu .  Prednisone  for cough and wheezing in the setting of asthma.  Orders: -     predniSONE ; Take 1 tablet (50 mg total) by mouth daily for 5 days.  Dispense: 5 tablet; Refill: 0 -     Oseltamivir  Phosphate; Take 1 capsule (75 mg total) by mouth 2 (two) times daily.  Dispense: 10 capsule; Refill: 0  Acute cough Assessment & Plan: Patient with acute cough, wheezing, fever.  Chest x-ray was obtained and I independently reviewed the x-ray and did not appreciate an infiltrate.  Given fever and the amount of influenza that is going around community, I am cover him empirically with Tamiflu .  Prednisone  for cough and wheezing in the setting of asthma.  Orders: -     DG Chest 2 View -     predniSONE ; Take 1 tablet (50 mg total) by mouth daily for 5 days.  Dispense: 5 tablet; Refill:  0    Follow-up:  Return if symptoms worsen or fail to improve.  Jacqulyn Ahle DO [facility] "

## 2024-05-21 NOTE — Patient Instructions (Signed)
 Chest xray today.  I will call with results and send in medication accordingly.

## 2024-05-21 NOTE — Assessment & Plan Note (Addendum)
 Patient with acute cough, wheezing, fever.  Chest x-ray was obtained and I independently reviewed the x-ray and did not appreciate an infiltrate.  Given fever and the amount of influenza that is going around community, I am cover him empirically with Tamiflu .  Prednisone  for cough and wheezing in the setting of asthma.

## 2024-05-26 ENCOUNTER — Encounter: Payer: Self-pay | Admitting: Family Medicine

## 2024-05-26 ENCOUNTER — Other Ambulatory Visit: Payer: Self-pay | Admitting: Family Medicine

## 2024-05-26 MED ORDER — MOXIFLOXACIN HCL 400 MG PO TABS: 400.0000 mg | ORAL_TABLET | Freq: Every day | ORAL | 0 refills | Status: AC

## 2024-10-02 ENCOUNTER — Ambulatory Visit: Admitting: Family Medicine
